# Patient Record
Sex: Male | Born: 1965 | Race: White | Hispanic: No | Smoking: Former smoker
Health system: Southern US, Community
[De-identification: ages and names within clinical notes are randomized; demographics above are authoritative.]

## PROBLEM LIST (undated history)

## (undated) DIAGNOSIS — J309 Allergic rhinitis, unspecified: Secondary | ICD-10-CM

## (undated) DIAGNOSIS — G4733 Obstructive sleep apnea (adult) (pediatric): Principal | ICD-10-CM

## (undated) DIAGNOSIS — E785 Hyperlipidemia, unspecified: Secondary | ICD-10-CM

## (undated) DIAGNOSIS — F909 Attention-deficit hyperactivity disorder, unspecified type: Secondary | ICD-10-CM

## (undated) DIAGNOSIS — F329 Major depressive disorder, single episode, unspecified: Secondary | ICD-10-CM

## (undated) HISTORY — DX: Attention-deficit hyperactivity disorder, unspecified type: F90.9

## (undated) HISTORY — DX: Obstructive sleep apnea (adult) (pediatric): G47.33

## (undated) HISTORY — DX: Major depressive disorder, single episode, unspecified: F32.9

## (undated) HISTORY — DX: Hyperlipidemia, unspecified: E78.5

## (undated) HISTORY — PX: NO PAST SURGERIES: SHX2092

## (undated) HISTORY — DX: Allergic rhinitis, unspecified: J30.9

---

## 2006-05-06 ENCOUNTER — Encounter: Admission: RE | Admit: 2006-05-06 | Discharge: 2006-05-06 | Payer: Self-pay | Admitting: Occupational Medicine

## 2006-06-30 ENCOUNTER — Ambulatory Visit: Payer: Self-pay | Admitting: Family Medicine

## 2006-07-02 ENCOUNTER — Ambulatory Visit: Payer: Self-pay | Admitting: Cardiology

## 2007-01-10 ENCOUNTER — Telehealth (INDEPENDENT_AMBULATORY_CARE_PROVIDER_SITE_OTHER): Payer: Self-pay | Admitting: *Deleted

## 2007-03-07 ENCOUNTER — Ambulatory Visit: Payer: Self-pay | Admitting: Family Medicine

## 2007-03-07 DIAGNOSIS — S60229A Contusion of unspecified hand, initial encounter: Secondary | ICD-10-CM | POA: Insufficient documentation

## 2007-03-08 ENCOUNTER — Telehealth (INDEPENDENT_AMBULATORY_CARE_PROVIDER_SITE_OTHER): Payer: Self-pay | Admitting: *Deleted

## 2007-03-13 ENCOUNTER — Encounter: Admission: RE | Admit: 2007-03-13 | Discharge: 2007-03-13 | Payer: Self-pay | Admitting: Family Medicine

## 2007-03-14 ENCOUNTER — Telehealth (INDEPENDENT_AMBULATORY_CARE_PROVIDER_SITE_OTHER): Payer: Self-pay | Admitting: *Deleted

## 2007-03-16 ENCOUNTER — Telehealth (INDEPENDENT_AMBULATORY_CARE_PROVIDER_SITE_OTHER): Payer: Self-pay | Admitting: *Deleted

## 2007-03-17 ENCOUNTER — Encounter (INDEPENDENT_AMBULATORY_CARE_PROVIDER_SITE_OTHER): Payer: Self-pay | Admitting: Family Medicine

## 2008-07-19 ENCOUNTER — Ambulatory Visit: Payer: Self-pay | Admitting: Internal Medicine

## 2008-07-19 ENCOUNTER — Telehealth (INDEPENDENT_AMBULATORY_CARE_PROVIDER_SITE_OTHER): Payer: Self-pay | Admitting: *Deleted

## 2008-07-24 ENCOUNTER — Telehealth (INDEPENDENT_AMBULATORY_CARE_PROVIDER_SITE_OTHER): Payer: Self-pay | Admitting: *Deleted

## 2008-09-10 ENCOUNTER — Telehealth (INDEPENDENT_AMBULATORY_CARE_PROVIDER_SITE_OTHER): Payer: Self-pay | Admitting: *Deleted

## 2008-09-11 ENCOUNTER — Telehealth (INDEPENDENT_AMBULATORY_CARE_PROVIDER_SITE_OTHER): Payer: Self-pay | Admitting: *Deleted

## 2010-10-03 NOTE — Assessment & Plan Note (Signed)
[facility]                        [facility] OFFICE NOTE   NAME:Yu, Larry                          MRN:          [id number]  DATE:06/30/2006                            DOB:          08/26/1965    REASON FOR VISIT:  Establish care and abnormal chest x-ray.  Larry Yu  is a 45 year old male who had a pre-employment physical at [facility] in December 2007.  The chest x-ray done showed a  noncalcified nodule in the left upper lobe.  A primary lung carcinoma  cannot be excluded, and thus a CT scan was recommended.  The patient is  here to have further evaluation.  He reports that he is completely  asymptomatic.  Denies any weight loss, weight gain, fever, or any other  constitutional symptoms.  He does report that he smokes a cigar  occasionally.  He denies any significant family history of cancer.   The patient also would like to be referred at a later date for a  vasectomy and will let us know.   During the pre-employment physical, he had a cholesterol panel  performed, which showed an elevated triglyceride of 366.  The patient  did state that, typically with lifestyle changes, he could bring that  back down to normal.   PAST MEDICAL HISTORY:  Esophageal dilatation.   MEDICATIONS:  None.   ALLERGIES:  No known drug allergies.   FAMILY HISTORY:  Mother alive with history of hypertension.  Father  alive and well.  He has 4 siblings who are alive and well.   SOCIAL HISTORY:  The patient is a [employer] fund[employer], married with 2 children.  Smokes a cigar occasionally and drinks wine occasionally.   HEALTH MAINTENANCE ISSUES:  Up to date.   OBJECTIVE:  Weight 245.8, temperature 98.1, pulse 76, blood pressure  118/80.  GENERAL:  We have a pleasant male in no acute distress.  Answers  questions appropriately.  OROPHARYNX:  Benign.  NECK:  Supple.  No lymphadenopathy or carotid bruit, or JVD.  LUNGS:  Clear with good air movement.  HEART:  Regular rate and rhythm with normal S1, S2.  No murmurs,  gallops, or rubs.   REVIEW OF X-RAY REPORT:  Consistent with a non-calcified nodule in the  left upper lobe.  Recommended was CT for further assessment.   REVIEW OF LABORATORY DATA:  From [facility] showed a total cholesterol of  230, triglyceride 366, HDL 31, LDL 118.   IMPRESSION:  1. Left upper lobe nodule, unclear etiology.  2. Hypertriglyceridemia.   PLAN:  1. Will schedule a CT scan of the lungs with and without contrast for      further assessment.  2. The patient is to schedule an appointment in 3 months to have a      lipid profile rechecked.  3. Diet changes were encouraged.  4. The patient will call in the near future to be referred for      vasectomy.  5. Further recommendations regarding the lung nodule after review of      the CT scan.  The  patient expressed understanding.     Leanne Chang, M.D.  Electronically Signed    LA/MedQ  DD: 06/30/2006  DT: 06/30/2006  Job #: [id number]

## 2010-10-03 NOTE — Assessment & Plan Note (Signed)
[facility]                        [facility] OFFICE NOTE   NAME:Hulon, Asencion                          MRN:          [id number]  DATE:06/30/2006                            DOB:          [birth year]    REASON FOR VISIT:  To establish care and abnormal chest x-ray.   Mr. Villarruel is a 45 year old male who reports   INCOMPLETE     Leanne Chang, M.D.  Electronically Signed    LA/MedQ  DD: 06/30/2006  DT: 06/30/2006  Job #: [id number]

## 2011-05-27 ENCOUNTER — Ambulatory Visit (INDEPENDENT_AMBULATORY_CARE_PROVIDER_SITE_OTHER): Payer: Self-pay | Admitting: Internal Medicine

## 2011-05-27 ENCOUNTER — Encounter: Payer: Self-pay | Admitting: Internal Medicine

## 2011-05-27 VITALS — BP 90/62 | HR 50 | Temp 97.9°F | Resp 14 | Wt 232.4 lb

## 2011-05-27 DIAGNOSIS — J209 Acute bronchitis, unspecified: Secondary | ICD-10-CM

## 2011-05-27 DIAGNOSIS — J019 Acute sinusitis, unspecified: Secondary | ICD-10-CM

## 2011-05-27 DIAGNOSIS — R233 Spontaneous ecchymoses: Secondary | ICD-10-CM

## 2011-05-27 LAB — CBC WITH DIFFERENTIAL/PLATELET
Basophils Relative: 0.9 % (ref 0.0–3.0)
Eosinophils Relative: 2.6 % (ref 0.0–5.0)
HCT: 42 % (ref 39.0–52.0)
Hemoglobin: 14.4 g/dL (ref 13.0–17.0)
Lymphocytes Relative: 20.5 % (ref 12.0–46.0)
Lymphs Abs: 1.7 10*3/uL (ref 0.7–4.0)
Monocytes Relative: 9 % (ref 3.0–12.0)
Neutro Abs: 5.5 10*3/uL (ref 1.4–7.7)
RBC: 4.69 Mil/uL (ref 4.22–5.81)
WBC: 8.2 10*3/uL (ref 4.5–10.5)

## 2011-05-27 MED ORDER — FLUTICASONE PROPIONATE 50 MCG/ACT NA SUSP: 1.0000 | Freq: Two times a day (BID) | NASAL | Status: DC | PRN

## 2011-05-27 MED ORDER — AMOXICILLIN-POT CLAVULANATE 875-125 MG PO TABS: 1.0000 | ORAL_TABLET | Freq: Two times a day (BID) | ORAL | Status: AC

## 2011-05-27 MED ORDER — HYDROCODONE-HOMATROPINE 5-1.5 MG/5ML PO SYRP: 5.0000 mL | ORAL_SOLUTION | Freq: Four times a day (QID) | ORAL | Status: AC | PRN

## 2011-05-27 NOTE — Progress Notes (Signed)
  Subjective:    Patient ID: Larry Yu, male    DOB: 14-Sep-1965, 46 y.o.   MRN: [id number]  HPI Respiratory tract infection Onset/symptoms:1 week ago as aching & fatigue. Note he did take the flu shot last fall Exposures (illness/environmental/extrinsic):friends ill; diagnoses included walking pneumonia, bronchitis, sinusitis and flu among those individuals Progression of symptoms:to cough Treatments/response:OTC meds / partial benefit Present symptoms: Fever/chills/sweats:no Frontal headache:yes Facial pain:yes Nasal purulence:no Sore throat:yes Dental pain:no Lymphadenopathy:no Wheezing/shortness of breath:both Cough/sputum/hemoptysis:yellow green sputum Pleuritic pain:no  Past medical history: Seasonal allergies: no/asthma:no Smoking history: Occasional cigar           Review of Systems   He is also noted some bruising recently; he has been taking nonsteroidals recently. He denies epistaxis, hemoptysis, hematuria, rectal bleeding, or melena.     Objective:   Physical Exam General appearance:good health ;well nourished; no acute distress or increased work of breathing is present.  No  lymphadenopathy of axilla noted; shotty lymph nodes in the left posterior neck.   Eyes: No conjunctival inflammation or lid edema is present.   Ears:  External ear exam shows no significant lesions or deformities.  Otoscopic examination reveals clear canals, tympanic membranes are intact bilaterally without bulging, retraction, inflammation or discharge.  Nose:  External nasal examination shows no deformity or inflammation. Nasal mucosa are erythematous and boggy on the right without lesions or exudates. No septal dislocation or deviation.Minimal  obstruction to airflow on R Oral exam: Dental hygiene is good; lips and gums are healthy appearing.There is no oropharyngeal erythema or exudate noted.   Heart:  Slow rate and regular rhythm. S1 and S2 normal without gallop, murmur, click, rub  or other extra sounds.   Lungs:Chest clear to auscultation; no wheezes, rhonchi,rales ,or rubs present.No increased work of breathing.  Abdomen: Scaphoid; no organomegaly or masses present    Extremities:  No cyanosis, edema, or clubbing  noted    Skin: Warm & dry ; faint bruising over the left biceps and over the right lower quadrant abdomen          Assessment & Plan:  #1 bronchitis with purulent secretions. He describes some wheezing; clinically no reactive airways disease is present at this time  #2 probable rhinosinusitis although he denies purulent secretions.  #3 easy bruising  Plan: See orders and recommendations

## 2011-05-27 NOTE — Patient Instructions (Signed)
Plain Mucinex for thick secretions ;force NON dairy fluids . Use a Neti pot daily as needed for sinus congestion .Flonase 1 spray in each nostril twice a day as needed. Use the "crossover" technique as discussed   

## 2012-08-12 ENCOUNTER — Ambulatory Visit (INDEPENDENT_AMBULATORY_CARE_PROVIDER_SITE_OTHER): Payer: Self-pay | Admitting: Internal Medicine

## 2012-08-12 ENCOUNTER — Encounter: Payer: Self-pay | Admitting: Internal Medicine

## 2012-08-12 VITALS — BP 114/78 | HR 69 | Temp 98.2°F | Wt 255.4 lb

## 2012-08-12 DIAGNOSIS — J019 Acute sinusitis, unspecified: Secondary | ICD-10-CM

## 2012-08-12 DIAGNOSIS — J029 Acute pharyngitis, unspecified: Secondary | ICD-10-CM

## 2012-08-12 DIAGNOSIS — J020 Streptococcal pharyngitis: Secondary | ICD-10-CM

## 2012-08-12 MED ORDER — AMOXICILLIN 500 MG PO CAPS: 500.0000 mg | ORAL_CAPSULE | Freq: Three times a day (TID) | ORAL | Status: DC

## 2012-08-12 MED ORDER — FLUTICASONE PROPIONATE 50 MCG/ACT NA SUSP: 1.0000 | Freq: Two times a day (BID) | NASAL | Status: DC | PRN

## 2012-08-12 NOTE — Patient Instructions (Addendum)
Plain Mucinex (NOT D) for thick secretions ;force NON dairy fluids .   Nasal cleansing in the shower as discussed with lather of mild shampoo.After 10 seconds wash off lather while  exhaling through nostrils. Make sure that all residual soap is removed to prevent irritation.  Fluticasone 1 spray in each nostril twice a day as needed. Use the "crossover" technique into opposite nostril spraying toward opposite ear @ 45 degree angle, not straight up into nostril.  Use a Neti pot daily only  as needed for significant sinus congestion; going from open side to congested side . Plain Allegra (NOT D )  160 daily , Loratidine 10 mg , OR Zyrtec 10 mg @ bedtime  as needed for itchy eyes & sneezing. Zicam Melts or Zinc lozenges as per package label for scratchy throat .

## 2012-08-12 NOTE — Progress Notes (Signed)
  Subjective:    Patient ID: Larry Yu, male    DOB: 12-Jan-1966, 47 y.o.   MRN: [id number]  HPI  Symptoms began 08/08/12 as sinus pressure in the frontal, facial, and ethmoid sinus areas associated with a sore throat. As of 3/26 he's had nasal purulence. He also has a cough with yellow sputum with some blood as of today. The volume of secretions is greater from the sinuses than from the chest. He believes that the blood-tinged material was coming from the sinuses. This was associated with edema of the uvula.  He's had some extrinsic symptoms of itchy, watery eyes and sneezing.  Rx: Nonsteroidals and Benadryl sinus over-the-counter were of some benefit. He had been out of town and did not have access to his fluticasone    Review of Systems He denies significant wheezing or shortness of breath. He also has had no fever, chills, or sweats     Objective:   Physical Exam  General appearance:good health ;well nourished; no acute distress or increased work of breathing is present.  No  lymphadenopathy about the head, neck, or axilla noted.   Eyes: No conjunctival inflammation or lid edema is present.   Ears:  External ear exam shows no significant lesions or deformities.  Otoscopic examination reveals clear canals, tympanic membranes are intact bilaterally with erythema but w/o  bulging or discharge.  Nose:  External nasal examination shows no deformity or inflammation. Nasal mucosa are pink and moist without lesions or exudates. No septal dislocation or deviation.No obstruction to airflow.   Oral exam: Dental hygiene is good; lips and gums are healthy appearing.There is uvular erythema & slight edema w/o exudate noted. Hoarse  Neck:  No deformities, masses, or tenderness noted.   Supple with full range of motion without pain.   Heart:  Normal rate and regular rhythm. S1 and S2 normal without gallop, murmur, click, rub or other extra sounds. S4  Lungs:Chest clear to auscultation; no wheezes,  rhonchi,rales ,or rubs present.No increased work of breathing.    Extremities:  No cyanosis, edema, or clubbing  noted    Skin: Warm & dry          Assessment & Plan:  #1 rhinosinusitis with purulent discharge with some blood-tinged character  #2 pharyngitis, negative strep   plan: See orders and recommendations

## 2012-12-21 ENCOUNTER — Encounter: Payer: Self-pay | Admitting: Internal Medicine

## 2012-12-21 ENCOUNTER — Ambulatory Visit (INDEPENDENT_AMBULATORY_CARE_PROVIDER_SITE_OTHER): Payer: BC Managed Care – PPO | Admitting: Internal Medicine

## 2012-12-21 VITALS — BP 110/80 | HR 55 | Temp 97.9°F | Ht 75.25 in | Wt 252.2 lb

## 2012-12-21 DIAGNOSIS — F32A Depression, unspecified: Secondary | ICD-10-CM

## 2012-12-21 DIAGNOSIS — Z0000 Encounter for general adult medical examination without abnormal findings: Secondary | ICD-10-CM

## 2012-12-21 DIAGNOSIS — F329 Major depressive disorder, single episode, unspecified: Secondary | ICD-10-CM

## 2012-12-21 DIAGNOSIS — J309 Allergic rhinitis, unspecified: Secondary | ICD-10-CM

## 2012-12-21 DIAGNOSIS — J302 Other seasonal allergic rhinitis: Secondary | ICD-10-CM | POA: Insufficient documentation

## 2012-12-21 HISTORY — DX: Depression, unspecified: F32.A

## 2012-12-21 MED ORDER — BUPROPION HCL 100 MG PO TABS: 100.0000 mg | ORAL_TABLET | Freq: Two times a day (BID) | ORAL | Status: DC

## 2012-12-21 NOTE — Assessment & Plan Note (Addendum)
Moderate depression, PHQ 9---->scored 12. Has minimal anxiety. I strongly encouraged him to continue going to counseling. We talk about Wellbutrin versus SSRIs. I think wellbutrin  is a better fit for him   since he has depression >> anxiety  Plan: Wellbutrin 100 mg twice a day, see instructions, return in 6 weeks.  Additional time spent with patient today assessing and treating depression

## 2012-12-21 NOTE — Assessment & Plan Note (Addendum)
Td per pt ~ 2008 Never had a cscope  EKG sinus brady Diet and exercise discussed, encouraged to keep going to the gym, [employer]?. Labs  He feels fatigued, has occ ED--- will reassess sxs after he is on Wellbutrin and improve his lifestyle

## 2012-12-21 NOTE — Patient Instructions (Addendum)
Start Wellbutrin 100 mg one tablet a day for 10 days, then one tablet twice a day.  Please come back for another visit in 6 weeks

## 2012-12-21 NOTE — Progress Notes (Signed)
  Subjective:    Patient ID: Larry Yu, male    DOB: 29-May-1965, 47 y.o.   MRN: [id number]  HPI CPX Also concerned about depression, anxiety  going on for a while---> he divorce and remarriage 2 years ago, his kids live out of state, some issues with his new family. They are already doing   counseling and it seems to be working. No suicidal ideas. Minimal anxiety if any.  Past Medical History  Diagnosis Date  . Allergic rhinitis    Past Surgical History  Procedure Laterality Date  . No past surgeries     History   Social History  . Marital Status: Married    Spouse Name: N/A    Number of Children: 2  . Years of Education: N/A   Occupational History  . Not on file.   Social History Main Topics  . Smoking status: Passive Smoke Exposure - Never Smoker    Types: Cigars  . Smokeless tobacco: Never Used     Comment: Cigars  occasionally.  . Alcohol Use: Yes     Comment: socially   . Drug Use: No  . Sexually Active: Not on file   Other Topics Concern  . Not on file   Social History Narrative   Divorced, remarried 2013, household is pt, wife and two children fem wife   Family History  Problem Relation Age of Onset  . CAD Neg Hx   . Diabetes Neg Hx   . Colon cancer Neg Hx   . Prostate cancer Neg Hx   . Hypertension Mother     Review of Systems Diet and exercise-- "not good" but he started to go to the gym again  last week  Occasionally fatigue, not inappropriate falling asleep. Denies chest pain or shortness or breath No  nausea, vomiting, constipation or blood in the stools. Occasionally loose stools . No weight loss. No dysuria, gross hematuria and difficulty urinating. Occasionally problems with erections.     Objective:   Physical Exam BP 110/80  Pulse 55  Temp(Src) 97.9 F (36.6 C) (Oral)  Ht 6' 3.25" (1.911 m)  Wt 252 lb 3.2 oz (114.397 kg)  BMI 31.33 kg/m2  SpO2 98%  General -- alert, well-developed, NAD.   Neck --no thyromegaly Lungs --  normal respiratory effort, no intercostal retractions, no accessory muscle use, and normal breath sounds.   Heart-- normal rate, regular rhythm, no murmur, and no gallop.   Abdomen--soft, non-tender, no distention, no masses  Extremities-- no pretibial edema bilaterally Neurologic-- alert & oriented X3 and strength normal in all extremities. Psych-- Cognition and judgment appear intact. Alert and cooperative with normal attention span and concentration.  not anxious appearing and not depressed appearing.       Assessment & Plan:

## 2012-12-22 ENCOUNTER — Encounter: Payer: Self-pay | Admitting: Internal Medicine

## 2012-12-22 LAB — COMPREHENSIVE METABOLIC PANEL
Albumin: 4.7 g/dL (ref 3.5–5.2)
BUN: 15 mg/dL (ref 6–23)
CO2: 30 mEq/L (ref 19–32)
Calcium: 9.7 mg/dL (ref 8.4–10.5)
Chloride: 100 mEq/L (ref 96–112)
Creatinine, Ser: 1.1 mg/dL (ref 0.4–1.5)
GFR: 74.38 mL/min (ref >60.00)
Potassium: 4.3 mEq/L (ref 3.5–5.1)

## 2012-12-22 LAB — LIPID PANEL
HDL: 39.2 mg/dL (ref >39.00)
Triglycerides: 279 mg/dL — ABNORMAL HIGH (ref 0.0–149.0)

## 2012-12-22 LAB — LDL CHOLESTEROL, DIRECT: Direct LDL: 151 mg/dL

## 2012-12-22 LAB — TSH: TSH: 0.82 u[IU]/mL (ref 0.35–5.50)

## 2012-12-23 ENCOUNTER — Encounter: Payer: Self-pay | Admitting: Internal Medicine

## 2013-02-06 ENCOUNTER — Encounter: Payer: Self-pay | Admitting: Internal Medicine

## 2013-02-06 ENCOUNTER — Ambulatory Visit (INDEPENDENT_AMBULATORY_CARE_PROVIDER_SITE_OTHER): Payer: BC Managed Care – PPO | Admitting: Internal Medicine

## 2013-02-06 VITALS — BP 125/83 | HR 55 | Temp 98.7°F | Wt 243.4 lb

## 2013-02-06 DIAGNOSIS — N529 Male erectile dysfunction, unspecified: Secondary | ICD-10-CM | POA: Insufficient documentation

## 2013-02-06 DIAGNOSIS — F32A Depression, unspecified: Secondary | ICD-10-CM

## 2013-02-06 DIAGNOSIS — F329 Major depressive disorder, single episode, unspecified: Secondary | ICD-10-CM

## 2013-02-06 MED ORDER — ESCITALOPRAM OXALATE 10 MG PO TABS: 10.0000 mg | ORAL_TABLET | Freq: Every day | ORAL | Status: DC

## 2013-02-06 NOTE — Assessment & Plan Note (Addendum)
ongoing symptoms, would like to try something. Samples of Cialis 20 mg #3 tablets provided, side effects discussed, to take half or one tablet qod as needed. Will call for a prescription if needed.

## 2013-02-06 NOTE — Assessment & Plan Note (Addendum)
Started Wellbutrin, it did help some after 2 or 3 weeks into treatment however at this point he's feeling about the same, he has also developed some anxiety and a couple of times had suicidal thoughts. Plan: Patient is counseled and encouraged to continue seeing the counselors Start Lexapro, decrease Wellbutrin  Counseled about suicidality F/u 4-5 weeks

## 2013-02-06 NOTE — Progress Notes (Signed)
  Subjective:    Patient ID: Larry Yu, male    DOB: [birth year], 47 y.o.   MRN: [id number]  HPI Followup Patient was depressed, started Wellbutrin, initially had some side effects "like I took too much caffeinne", after that he felt better, more motivated and energetic however in the last 2 weeks his back to his previous state.  Past Medical History  Diagnosis Date  . Allergic rhinitis    Past Surgical History  Procedure Laterality Date  . No past surgeries      Review of Systems Relationship w/ wife is not well, thinks they will probably divorce. They are seeing a marriage and a independent Veterinary surgeon .  No thoughts  of violence. Had "tension" in the shoulders and headache for several days but is better x the last 2 days. ED : Treatment?    Objective:   Physical Exam BP 125/83  Pulse 55  Temp(Src) 98.7 F (37.1 C)  Wt 243 lb 6.4 oz (110.406 kg)  BMI 30.23 kg/m2  SpO2 100%  General -- alert, well-developed, NAD.   Psych-- Cognition and judgment appear intact. Cooperative with normal attention span and concentration. No anxious appearing , no depressed appearing.       Assessment & Plan:  Labs reviewed, increased triglycerides, recommend diet. Minimal the elevated LFTs, recheck yearly

## 2013-02-06 NOTE — Patient Instructions (Addendum)
Start Lexapro Decrease Wellbutrin to 1 tablet daily. After 3 weeks, stop Wellbutrin. Next visit 4-5 weeks. Call if side effects , increased anxiety or  suicidal ideas.

## 2013-03-08 ENCOUNTER — Encounter: Payer: Self-pay | Admitting: Internal Medicine

## 2013-03-08 ENCOUNTER — Ambulatory Visit: Payer: BC Managed Care – PPO | Admitting: Internal Medicine

## 2013-03-08 ENCOUNTER — Ambulatory Visit (INDEPENDENT_AMBULATORY_CARE_PROVIDER_SITE_OTHER): Payer: BC Managed Care – PPO | Admitting: Internal Medicine

## 2013-03-08 VITALS — BP 123/76 | HR 67 | Temp 98.6°F | Wt 245.0 lb

## 2013-03-08 DIAGNOSIS — F329 Major depressive disorder, single episode, unspecified: Secondary | ICD-10-CM

## 2013-03-08 DIAGNOSIS — F32A Depression, unspecified: Secondary | ICD-10-CM

## 2013-03-08 DIAGNOSIS — F3289 Other specified depressive episodes: Secondary | ICD-10-CM

## 2013-03-08 DIAGNOSIS — N529 Male erectile dysfunction, unspecified: Secondary | ICD-10-CM

## 2013-03-08 MED ORDER — ESCITALOPRAM OXALATE 10 MG PO TABS: 10.0000 mg | ORAL_TABLET | Freq: Every day | ORAL | Status: DC

## 2013-03-08 MED ORDER — BUPROPION HCL 100 MG PO TABS: 100.0000 mg | ORAL_TABLET | Freq: Every day | ORAL | Status: DC

## 2013-03-08 MED ORDER — TADALAFIL 20 MG PO TABS: 10.0000 mg | ORAL_TABLET | ORAL | Status: DC | PRN

## 2013-03-08 NOTE — Assessment & Plan Note (Addendum)
Lexapro helped significantly, he felt tired so decided to restart Wellbutrin 100 mg one tablet daily, developed some headaches, then he change Wellbutrin to half tablet twice a day. With that regimen symptoms are under excellent control. Plan: Refill medications, continue counseling, followup in 3 months.

## 2013-03-08 NOTE — Progress Notes (Signed)
  Subjective:    Patient ID: Larry Yu, male    DOB: 07/15/1965, 47 y.o.   MRN: [id number]  HPI Followup from previous visit. Doing very well with a combination of Lexapro and Wellbutrin. Also, Cialis did help    Past Medical History  Diagnosis Date  . Allergic rhinitis    Past Surgical History  Procedure Laterality Date  . No past surgeries       Review of Systems Denies suicidal ideas Sleeping well Still sees a Veterinary surgeon.     Objective:   Physical Exam  BP 123/76  Pulse 67  Temp(Src) 98.6 F (37 C)  Wt 245 lb (111.131 kg)  BMI 30.43 kg/m2  SpO2 99% General -- alert, well-developed, NAD.   Psych-- Cognition and judgment appear intact. Cooperative with normal attention span and concentration. No anxious appearing , no depressed appearing.    Assessment & Plan:

## 2013-03-08 NOTE — Patient Instructions (Signed)
Next visit in 3 months  for a  depression flollow up   Please make an appointment

## 2013-03-08 NOTE — Assessment & Plan Note (Signed)
cialis worked, RF provided

## 2013-04-25 ENCOUNTER — Encounter: Payer: Self-pay | Admitting: Family Medicine

## 2013-04-25 ENCOUNTER — Ambulatory Visit (INDEPENDENT_AMBULATORY_CARE_PROVIDER_SITE_OTHER): Payer: BC Managed Care – PPO | Admitting: Family Medicine

## 2013-04-25 VITALS — BP 126/80 | HR 78 | Temp 98.0°F | Wt 248.8 lb

## 2013-04-25 DIAGNOSIS — J40 Bronchitis, not specified as acute or chronic: Secondary | ICD-10-CM

## 2013-04-25 MED ORDER — AZITHROMYCIN 250 MG PO TABS: ORAL_TABLET | ORAL | Status: DC

## 2013-04-25 MED ORDER — GUAIFENESIN-CODEINE 100-10 MG/5ML PO SYRP: ORAL_SOLUTION | ORAL | Status: DC

## 2013-04-25 NOTE — Progress Notes (Signed)
  Subjective:     Larry Yu is a 47 y.o. male here for evaluation of a cough. Onset of symptoms was 4 days ago. Symptoms have been gradually worsening since that time. The cough is productive and is aggravated by infection and reclining position. Associated symptoms include: shortness of breath, sputum production and wheezing. Patient does not have a history of asthma. Patient does have a history of environmental allergens. Patient has not traveled recently. Patient does not have a history of smoking. Patient has not had a previous chest x-ray. Patient has not had a PPD done.  The following portions of the patient's history were reviewed and updated as appropriate: allergies, current medications, past family history, past medical history, past social history, past surgical history and problem list.  Review of Systems Pertinent items are noted in HPI.    Objective:    Oxygen saturation 98% on room air BP 126/80  Pulse 78  Temp(Src) 98 F (36.7 C) (Oral)  Wt 248 lb 12.8 oz (112.855 kg)  SpO2 98% General appearance: alert, cooperative, appears stated age and no distress Ears: normal TM's and external ear canals both ears Nose: Nares normal. Septum midline. Mucosa normal. No drainage or sinus tenderness. Throat: lips, mucosa, and tongue normal; teeth and gums normal Neck: no adenopathy, supple, symmetrical, trachea midline and thyroid not enlarged, symmetric, no tenderness/mass/nodules Lungs: diminished breath sounds bibasilar Heart: S1, S2 normal    Assessment:    Acute Bronchitis    Plan:    Antibiotics per medication orders. Antitussives per medication orders. Avoid exposure to tobacco smoke and fumes. Call if shortness of breath worsens, blood in sputum, change in character of cough, development of fever or chills, inability to maintain nutrition and hydration. Avoid exposure to tobacco smoke and fumes. f/u prn

## 2013-04-25 NOTE — Patient Instructions (Signed)

## 2013-05-18 DIAGNOSIS — G4733 Obstructive sleep apnea (adult) (pediatric): Secondary | ICD-10-CM

## 2013-05-18 HISTORY — DX: Obstructive sleep apnea (adult) (pediatric): G47.33

## 2013-06-08 ENCOUNTER — Ambulatory Visit (INDEPENDENT_AMBULATORY_CARE_PROVIDER_SITE_OTHER): Payer: BC Managed Care – PPO | Admitting: Internal Medicine

## 2013-06-08 ENCOUNTER — Encounter: Payer: Self-pay | Admitting: Internal Medicine

## 2013-06-08 VITALS — BP 112/76 | HR 76 | Temp 98.0°F | Wt 250.0 lb

## 2013-06-08 DIAGNOSIS — R0683 Snoring: Secondary | ICD-10-CM

## 2013-06-08 DIAGNOSIS — F329 Major depressive disorder, single episode, unspecified: Secondary | ICD-10-CM

## 2013-06-08 DIAGNOSIS — F3289 Other specified depressive episodes: Secondary | ICD-10-CM

## 2013-06-08 DIAGNOSIS — R0609 Other forms of dyspnea: Secondary | ICD-10-CM

## 2013-06-08 DIAGNOSIS — F32A Depression, unspecified: Secondary | ICD-10-CM

## 2013-06-08 DIAGNOSIS — R0989 Other specified symptoms and signs involving the circulatory and respiratory systems: Secondary | ICD-10-CM

## 2013-06-08 MED ORDER — ESCITALOPRAM OXALATE 10 MG PO TABS: 20.0000 mg | ORAL_TABLET | Freq: Every day | ORAL | Status: DC

## 2013-06-08 NOTE — Progress Notes (Signed)
   Subjective:    Patient ID: Larry Yu, male    DOB: 1965-08-14, 48 y.o.   MRN: [id number]  HPI Routine followup Good compliance with Lexapro and Wellbutrin For the last 2 months his symptoms have not been as well-controlled as before, slightly more depressed, worries more. Admits that there has been some issues with his wife , they are arguing more.  Past Medical History  Diagnosis Date  . Allergic rhinitis   . Depression 12/21/2012   Past Surgical History  Procedure Laterality Date  . No past surgeries      Review of Systems When asked, reports he is sleeping well except for severe snoring, sometimes he wakes himself up from snoring. Occasionally feels sleepy during the day. Denies chest pain, shortness of breath, nausea, vomiting or diarrhea No suicidal ideas     Objective:   Physical Exam BP 112/76  Pulse 76  Temp(Src) 98 F (36.7 C)  Wt 250 lb (113.399 kg)  SpO2 96% General -- alert, well-developed, NAD. NECK-- normal to palpation, throat slt crowded  Neurologic--  alert & oriented X3. Speech normal, gait normal, strength normal in all extremities.  Psych-- Cognition and judgment appear intact. Cooperative with normal attention span and concentration. No anxious or depressed appearing.      Assessment & Plan:

## 2013-06-08 NOTE — Assessment & Plan Note (Addendum)
Patient reports heavy snoring, scored 9 in the Epworth scale He is high risk for OSA, never tested Plan-- home sleep study

## 2013-06-08 NOTE — Patient Instructions (Signed)
Next visit is for routine check up regards lexapro  in 2 months  No need to come back fasting Please make an appointment

## 2013-06-08 NOTE — Assessment & Plan Note (Signed)
Needs better control, increase Lexapro from 10 mg to 20 mg daily Continue Wellbutrin 100 mg half tablet twice a day He is already doing counseling Exercise encouraged

## 2013-06-08 NOTE — Progress Notes (Signed)
Pre visit review using our clinic review tool, if applicable. No additional management support is needed unless otherwise documented below in the visit note. 

## 2013-06-13 ENCOUNTER — Telehealth: Payer: Self-pay | Admitting: *Deleted

## 2013-06-13 NOTE — Telephone Encounter (Signed)
Still waiting on response from Dr. Larose Kells.

## 2013-06-13 NOTE — Telephone Encounter (Signed)
Patient was calling to check the status of his sleep study referral because his insurance did not cover it.

## 2013-06-15 ENCOUNTER — Other Ambulatory Visit: Payer: Self-pay | Admitting: *Deleted

## 2013-06-15 DIAGNOSIS — R0683 Snoring: Secondary | ICD-10-CM

## 2013-06-15 NOTE — Telephone Encounter (Signed)
Message copied by Dorian Pod on Thu Jun 15, 2013  8:37 AM ------      Message from: Kathlene November E      Created: Wed Jun 14, 2013  6:29 PM      Regarding: RE: home sleep study       Please arrange a pulmonary referral      ----- Message -----         From: Dorian Pod         Sent: 06/09/2013   2:07 PM           To: Colon Branch, MD      Subject: home sleep study                                         Patient does not have insurance coverage for home sleep study. How would you like to proceed?       ------

## 2013-06-15 NOTE — Telephone Encounter (Signed)
Can you enter pulmonary referral per Dr. Larose Kells? Thank you!

## 2013-06-15 NOTE — Telephone Encounter (Signed)
Referral for pulmonology has been entered. JG//CMA

## 2013-06-16 ENCOUNTER — Encounter: Payer: Self-pay | Admitting: Pulmonary Disease

## 2013-06-16 ENCOUNTER — Ambulatory Visit (INDEPENDENT_AMBULATORY_CARE_PROVIDER_SITE_OTHER): Payer: BC Managed Care – PPO | Admitting: Pulmonary Disease

## 2013-06-16 VITALS — BP 120/78 | HR 65 | Temp 98.0°F | Ht 75.0 in | Wt 257.4 lb

## 2013-06-16 DIAGNOSIS — G4733 Obstructive sleep apnea (adult) (pediatric): Secondary | ICD-10-CM

## 2013-06-16 NOTE — Patient Instructions (Signed)
Will schedule for home sleep test, and will review once results are available. Work on weight reduction.

## 2013-06-16 NOTE — Assessment & Plan Note (Signed)
The patient's history is classic for significant obstructive sleep apnea. He has loud snoring, witnessed apneas, nonrestorative sleep, and definite sleep pressure during the day with inactivity. He is overweight with a large neck. It had a long discussion with him about the pathophysiology of sleep apnea, including its impact to his quality of life and cardiovascular health. I think he needs to have a sleep study for diagnosis, and he is a very good candidate for home sleep testing. The patient is agreeable to this approach.

## 2013-06-16 NOTE — Progress Notes (Signed)
Subjective:    Patient ID: Larry Yu, male    DOB: 12-Sep-1965, 48 y.o.   MRN: [id number]  HPI The patient is a 49 year old male who I've been asked to see for possible obstructive sleep apnea. He has been noted to have loud snoring by his spouse, as well as an abnormal breathing pattern during sleep. He has frequent awakenings at night, and is not rested in the mornings upon arising. He notes definite inappropriate daytime sleepiness while at work when quiet, and has to get up and walk around in order to maintain his attention. He also drinks quite a bit of caffeine during the day as well. He notes sleepiness in the evenings while trying during, and some sleep pressure driving longer distances. His weight is up 30-40 pounds over the last 2 years, and his Epworth score today is abnormal at 16.   Sleep Questionnaire What time do you typically go to bed?( Between what hours) 10p-12a 10p-12a at 1601 on 06/16/13 by Virl Cagey, CMA How long does it take you to fall asleep? 68min-1hr 39min-1hr at 1601 on 06/16/13 by Virl Cagey, CMA How many times during the night do you wake up? 3 3 at 1601 on 06/16/13 by Virl Cagey, CMA What time do you get out of bed to start your day? 0700 0700 at 1601 on 06/16/13 by Virl Cagey, CMA Do you drive or operate heavy machinery in your occupation? Yes Yes at 1601 on 06/16/13 by Virl Cagey, CMA How much has your weight changed (up or down) over the past two years? (In pounds) 40 lb (18.144 kg) 40 lb (18.144 kg) at 1601 on 06/16/13 by Virl Cagey, CMA Have you ever had a sleep study before? No No at 1601 on 06/16/13 by Virl Cagey, CMA Do you currently use CPAP? No No at 1601 on 06/16/13 by Virl Cagey, CMA Do you wear oxygen at any time? No    Review of Systems  Constitutional: Negative for fever and unexpected weight change.  HENT: Negative for congestion, dental problem, ear pain, nosebleeds, postnasal drip,  rhinorrhea, sinus pressure, sneezing, sore throat and trouble swallowing.   Eyes: Negative for redness and itching.  Respiratory: Positive for cough and shortness of breath. Negative for chest tightness and wheezing.   Cardiovascular: Negative for palpitations and leg swelling.  Gastrointestinal: Negative for nausea and vomiting.       Acid heartburn  Genitourinary: Negative for dysuria.  Musculoskeletal: Negative for joint swelling.  Skin: Negative for rash.  Neurological: Negative for headaches.  Hematological: Does not bruise/bleed easily.  Psychiatric/Behavioral: Positive for dysphoric mood. The patient is nervous/anxious.        Objective:   Physical Exam Constitutional:  Overweight male, no acute distress  HENT:  Nares patent without discharge  Oropharynx without exudate, palate and uvula are moderately elongated.   Eyes:  Perrla, eomi, no scleral icterus  Neck:  No JVD, no TMG  Cardiovascular:  Normal rate, regular rhythm, no rubs or gallops.  No murmurs        Intact distal pulses  Pulmonary :  Normal breath sounds, no stridor or respiratory distress   No rales, rhonchi, or wheezing  Abdominal:  Soft, nondistended, bowel sounds present.  No tenderness noted.   Musculoskeletal:  No lower extremity edema noted.  Lymph Nodes:  No cervical lymphadenopathy noted  Skin:  No cyanosis noted  Neurologic:  Alert, appropriate, moves all 4 extremities without obvious deficit.  Assessment & Plan:

## 2013-06-28 ENCOUNTER — Telehealth: Payer: Self-pay | Admitting: Pulmonary Disease

## 2013-06-28 NOTE — Telephone Encounter (Signed)
lmomtcb x1 for pt 

## 2013-06-28 NOTE — Telephone Encounter (Signed)
Returning call can be reached at [phone].Elnita Maxwell

## 2013-06-29 NOTE — Telephone Encounter (Signed)
Pt returned call and I explained to patient that with pt's insurance the home sleep study is not a covered benefit. Pt is going to contact his insurance company see how much they will cover doing an in lab study. Pt given procedure code and will return my call to after checking with his insurance to let us know where he wants to proceed with in lab study. [name]

## 2013-06-29 NOTE — Telephone Encounter (Signed)
Spoke with Suanne Marker and she states that they are still awaiting a precert but she will check on this.  Spoke with Larry Yu and advised that we are still awaiting and we will contact him when this is complete.  Will forward to [facility] for f/u

## 2013-06-29 NOTE — Telephone Encounter (Signed)
LMOAM for pt to return my call. HST was denied by insurance (not a covered benefit). If patient wishes to have an in lab study, pt's insurance will cover this with no pre cert required. Ref # [id number]. [name]

## 2013-07-07 NOTE — Telephone Encounter (Signed)
Called and spoke with patient again today. Asked patient if he contacted his insurance and decided if he wanted to proceed with a in lab study. Pt stated that he had "put this on the back burner and had forgot about it". Provided patient with the in lab procedure code for the study and advised him to contact his insurance first part of the week so we can close this phone message out and take care of his needs as well. [name] Pt stated that he would call them on Monday and would return my call. [name]

## 2013-07-17 NOTE — Telephone Encounter (Signed)
Called and spoke with patient and he stated that he wants to "hold off on in lab study for now". Advised patient that since the HST is not covered by his insurance an in lab study is the only way that we will be able to proceed to determine if he has sleep apnea. Pt voiced understanding and stated that he would call us back when he wanted to proceed.  Nothing else needed at this time. [name]

## 2013-08-08 ENCOUNTER — Ambulatory Visit (INDEPENDENT_AMBULATORY_CARE_PROVIDER_SITE_OTHER): Payer: BC Managed Care – PPO | Admitting: Internal Medicine

## 2013-08-08 ENCOUNTER — Encounter: Payer: Self-pay | Admitting: Internal Medicine

## 2013-08-08 VITALS — BP 130/77 | HR 77 | Temp 98.3°F | Wt 251.0 lb

## 2013-08-08 DIAGNOSIS — G4733 Obstructive sleep apnea (adult) (pediatric): Secondary | ICD-10-CM

## 2013-08-08 DIAGNOSIS — F3289 Other specified depressive episodes: Secondary | ICD-10-CM

## 2013-08-08 DIAGNOSIS — F329 Major depressive disorder, single episode, unspecified: Secondary | ICD-10-CM

## 2013-08-08 DIAGNOSIS — F32A Depression, unspecified: Secondary | ICD-10-CM

## 2013-08-08 NOTE — Patient Instructions (Signed)
myfitnesspal ?  If you need more information about a healthy diet,   visit  the American Heart Association, it  is a great resource online at:  http://www.richard-flynn.net/   Next visit is for a physical exam by July 2015, fasting Please make an appointment

## 2013-08-08 NOTE — Assessment & Plan Note (Addendum)
Tried Lexapro 20 mg for a couple of weeks, did not see improvement and felt tired, he went back to Lexapro 10 mg. He continue with Wellbutrin, currently taking 100 mg one tablet daily. With this regimen, things seems to be  doing well. Plan: Reassess on return to the office

## 2013-08-08 NOTE — Progress Notes (Signed)
   Subjective:    Patient ID: Larry Yu, male    DOB: 1966/04/12, 48 y.o.   MRN: [id number]  DOS:  08/08/2013 Type of  visit: Followup from previous visit  Was dx w/ OSA, unable to afford  tests , note from pulmonary reviewed Tried a higher dose of lexapro, didn't work foir him , see a/p     ROS Has not been able to improve his lifestyle Currently his mood is under good control. Still feels some fatigue and unrested in the morning.  Past Medical History  Diagnosis Date  . Allergic rhinitis   . Depression 12/21/2012  . Hyperlipidemia   . OSA (obstructive sleep apnea) 05-2013    Past Surgical History  Procedure Laterality Date  . No past surgeries      History   Social History  . Marital Status: Married    Spouse Name: N/A    Number of Children: 2  . Years of Education: N/A   Occupational History  . Chemist    Social History Main Topics  . Smoking status: Current Some Day Smoker -- 1.00 packs/day for 13 years    Types: Cigarettes, Cigars    Last Attempt to Quit: 05/18/1993  . Smokeless tobacco: Never Used     Comment: Cigars  occasionally.  . Alcohol Use: Yes     Comment: socially 1-2 drinks ( 2 drinks 3x week)  . Drug Use: No     Comment: QUIT 1995  . Sexual Activity: Not on file   Other Topics Concern  . Not on file   Social History Narrative   Divorced, remarried 2013, household is pt, wife and two children from his wife        Medication List       This list is accurate as of: 08/08/13  6:57 PM.  Always use your most recent med list.               buPROPion 100 MG tablet  Commonly known as:  WELLBUTRIN  Take 1 tablet (100 mg total) by mouth daily.     escitalopram 10 MG tablet  Commonly known as:  LEXAPRO  Take 10 mg by mouth daily.     fluticasone 50 MCG/ACT nasal spray  Commonly known as:  FLONASE  Place 1 spray into the nose 2 (two) times daily as needed for rhinitis.     tadalafil 20 MG tablet  Commonly known as:  CIALIS  Take  0.5-1 tablets (10-20 mg total) by mouth every other day as needed for erectile dysfunction.           Objective:   Physical Exam BP 130/77  Pulse 77  Temp(Src) 98.3 F (36.8 C)  Wt 251 lb (113.853 kg)  SpO2 95%  General -- alert, well-developed, NAD.  Extremities-- no pretibial edema bilaterally  Neurologic--  alert & oriented X3. Speech normal, gait normal, strength normal in all extremities.  Psych-- Cognition and judgment appear intact. Cooperative with normal attention span and concentration. No anxious or depressed appearing.       Assessment & Plan:   Today , I spent more than 15 min with the patient, >50% of the time counseling

## 2013-08-08 NOTE — Assessment & Plan Note (Signed)
Clinically he has a sleep apnea, saw pulmonary, he was unable to afford testing. Plan is to work on lifestyle, lose weight. Extensive discussion about what sleep apnea is and risks as well as diet and exercise

## 2013-08-08 NOTE — Progress Notes (Signed)
Pre visit review using our clinic review tool, if applicable. No additional management support is needed unless otherwise documented below in the visit note. 

## 2013-08-09 ENCOUNTER — Telehealth: Payer: Self-pay | Admitting: Internal Medicine

## 2013-08-09 NOTE — Telephone Encounter (Signed)
Relevant patient education assigned to patient using Emmi. ° °

## 2013-09-14 ENCOUNTER — Other Ambulatory Visit: Payer: Self-pay | Admitting: Internal Medicine

## 2013-09-18 ENCOUNTER — Other Ambulatory Visit: Payer: Self-pay | Admitting: Internal Medicine

## 2013-12-01 ENCOUNTER — Encounter: Payer: Self-pay | Admitting: Internal Medicine

## 2013-12-01 ENCOUNTER — Ambulatory Visit (INDEPENDENT_AMBULATORY_CARE_PROVIDER_SITE_OTHER): Payer: BC Managed Care – PPO | Admitting: Internal Medicine

## 2013-12-01 VITALS — BP 100/66 | HR 80 | Temp 97.9°F | Ht 76.0 in | Wt 256.0 lb

## 2013-12-01 DIAGNOSIS — F32A Depression, unspecified: Secondary | ICD-10-CM

## 2013-12-01 DIAGNOSIS — F329 Major depressive disorder, single episode, unspecified: Secondary | ICD-10-CM

## 2013-12-01 DIAGNOSIS — G4733 Obstructive sleep apnea (adult) (pediatric): Secondary | ICD-10-CM

## 2013-12-01 DIAGNOSIS — Z0000 Encounter for general adult medical examination without abnormal findings: Secondary | ICD-10-CM

## 2013-12-01 LAB — CBC WITH DIFFERENTIAL/PLATELET
Basophils Absolute: 0.1 10*3/uL (ref 0.0–0.1)
Basophils Relative: 0.9 % (ref 0.0–3.0)
EOS ABS: 0.1 10*3/uL (ref 0.0–0.7)
Eosinophils Relative: 1.9 % (ref 0.0–5.0)
HCT: 43.2 % (ref 39.0–52.0)
Hemoglobin: 14.8 g/dL (ref 13.0–17.0)
Lymphocytes Relative: 23.6 % (ref 12.0–46.0)
Lymphs Abs: 1.5 10*3/uL (ref 0.7–4.0)
MCHC: 34.3 g/dL (ref 30.0–36.0)
MCV: 88.9 fl (ref 78.0–100.0)
MONO ABS: 0.5 10*3/uL (ref 0.1–1.0)
Monocytes Relative: 8.5 % (ref 3.0–12.0)
NEUTROS PCT: 65.1 % (ref 43.0–77.0)
Neutro Abs: 4.1 10*3/uL (ref 1.4–7.7)
PLATELETS: 226 10*3/uL (ref 150.0–400.0)
RBC: 4.86 Mil/uL (ref 4.22–5.81)
RDW: 12.8 % (ref 11.5–15.5)
WBC: 6.3 10*3/uL (ref 4.0–10.5)

## 2013-12-01 LAB — COMPREHENSIVE METABOLIC PANEL
ALK PHOS: 58 U/L (ref 39–117)
ALT: 41 U/L (ref 0–53)
AST: 24 U/L (ref 0–37)
Albumin: 4.3 g/dL (ref 3.5–5.2)
BILIRUBIN TOTAL: 0.6 mg/dL (ref 0.2–1.2)
BUN: 16 mg/dL (ref 6–23)
CO2: 30 mEq/L (ref 19–32)
Calcium: 9.2 mg/dL (ref 8.4–10.5)
Chloride: 104 mEq/L (ref 96–112)
Creatinine, Ser: 1.3 mg/dL (ref 0.4–1.5)
GFR: 63.01 mL/min (ref >60.00)
GLUCOSE: 91 mg/dL (ref 70–99)
Potassium: 4.2 mEq/L (ref 3.5–5.1)
SODIUM: 139 meq/L (ref 135–145)
TOTAL PROTEIN: 6.9 g/dL (ref 6.0–8.3)

## 2013-12-01 LAB — LIPID PANEL
CHOLESTEROL: 192 mg/dL (ref 0–200)
HDL: 36.8 mg/dL — ABNORMAL LOW (ref >39.00)
LDL CALC: 112 mg/dL — AB (ref 0–99)
NonHDL: 155.2
TRIGLYCERIDES: 218 mg/dL — AB (ref 0.0–149.0)
Total CHOL/HDL Ratio: 5
VLDL: 43.6 mg/dL — AB (ref 0.0–40.0)

## 2013-12-01 MED ORDER — TADALAFIL 20 MG PO TABS: 10.0000 mg | ORAL_TABLET | ORAL | Status: DC | PRN

## 2013-12-01 MED ORDER — ESCITALOPRAM OXALATE 10 MG PO TABS: 10.0000 mg | ORAL_TABLET | Freq: Every day | ORAL | Status: DC

## 2013-12-01 MED ORDER — BUPROPION HCL 100 MG PO TABS: 100.0000 mg | ORAL_TABLET | Freq: Every day | ORAL | Status: DC

## 2013-12-01 NOTE — Progress Notes (Signed)
Pre visit review using our clinic review tool, if applicable. No additional management support is needed unless otherwise documented below in the visit note. 

## 2013-12-01 NOTE — Progress Notes (Signed)
Subjective:    Patient ID: Larry Yu, male    DOB: [birth year], 48 y.o.   MRN: [id number]  DOS:  12/01/2013 Type of visit - description: CPX History: Recently separated from his second wife, it was an amicable process, he is doing emotionally well.  ROS Sleep apnea, doing better, see assessment and plan Denies chest pain, difficulty breathing or lower extremity edema No nausea, vomiting, occasional diarrhea but no abdominal pain or blood in the stools. Denies respiratory symptoms such as cough, wheezing or sputum production. Diet has not been the best lately but plans to improve  Past Medical History  Diagnosis Date  . Allergic rhinitis   . Depression 12/21/2012  . Hyperlipidemia   . OSA (obstructive sleep apnea) 05-2013    Past Surgical History  Procedure Laterality Date  . No past surgeries      History   Social History  . Marital Status: Married    Spouse Name: N/A    Number of Children: 2  . Years of Education: N/A   Occupational History  . Chemist    Social History Main Topics  . Smoking status: Current Some Day Smoker -- 1.00 packs/day for 13 years    Types: Cigarettes, Cigars    Last Attempt to Quit: 05/18/1993  . Smokeless tobacco: Never Used     Comment: Cigars  occasionally.  . Alcohol Use: Yes     Comment: socially 1-2 drinks ( 2 drinks 3x week)  . Drug Use: No     Comment: QUIT 1995  . Sexual Activity: Not on file   Other Topics Concern  . Not on file   Social History Narrative   Divorced, remarried 2013, separated, lives by himself      Family History  Problem Relation Age of Onset  . CAD Neg Hx   . Diabetes Neg Hx   . Colon cancer Neg Hx   . Prostate cancer Neg Hx   . Hypertension Mother   . Stroke Neg Hx   . Dementia Mother     dx age 108       Medication List       This list is accurate as of: 12/01/13 11:59 PM.  Always use your most recent med list.               buPROPion 100 MG tablet  Commonly known as:  WELLBUTRIN   Take 1 tablet (100 mg total) by mouth daily.     escitalopram 10 MG tablet  Commonly known as:  LEXAPRO  Take 1 tablet (10 mg total) by mouth daily.     fluticasone 50 MCG/ACT nasal spray  Commonly known as:  FLONASE  INHALE 1 SPRAY INTO THE NOSE TWICE DAILY AS NEEDED FOR RHINITIS.     tadalafil 20 MG tablet  Commonly known as:  CIALIS  Take 0.5-1 tablets (10-20 mg total) by mouth every other day as needed for erectile dysfunction.           Objective:   Physical Exam BP 100/66  Pulse 80  Temp(Src) 97.9 F (36.6 C)  Ht 6\' 4"  (1.93 m)  Wt 256 lb (116.121 kg)  BMI 31.17 kg/m2  SpO2 94% General -- alert, well-developed, NAD.  Neck --no thyromegaly  HEENT-- Not pale. Lungs -- normal respiratory effort, no intercostal retractions, no accessory muscle use, and normal breath sounds.  Heart-- normal rate, regular rhythm, no murmur.  Abdomen-- Not distended, good bowel sounds,soft, non-tender. Extremities-- no pretibial edema  bilaterally  Neurologic--  alert & oriented X3. Speech normal, gait appropriate for age, strength symmetric and appropriate for age.  Psych-- Cognition and judgment appear intact. Cooperative with normal attention span and concentration. No anxious or depressed appearing.           Assessment & Plan:

## 2013-12-01 NOTE — Assessment & Plan Note (Addendum)
See previous entry, he tried the nose strip but that didn't help; currently using a mouthguard and has decrease his snoring significantly. Feeling more energetic and less sleepy.

## 2013-12-01 NOTE — Assessment & Plan Note (Signed)
Recently separated from Emotionally doing okay, good compliance with medications. No change

## 2013-12-01 NOTE — Patient Instructions (Signed)
Get your blood work before you leave   Next visit is for a physical exam in 1 year, sooner if needed, come back fasting Please make an appointment

## 2013-12-01 NOTE — Assessment & Plan Note (Addendum)
Td 2012 Never had a cscope  Diet and exercise discussed, several suggestions made Labs   RTC 1 year, chronic medical issues stable, meds RF x 1 year

## 2014-03-09 ENCOUNTER — Ambulatory Visit (INDEPENDENT_AMBULATORY_CARE_PROVIDER_SITE_OTHER): Payer: BC Managed Care – PPO | Admitting: Internal Medicine

## 2014-03-09 ENCOUNTER — Encounter: Payer: Self-pay | Admitting: Internal Medicine

## 2014-03-09 VITALS — BP 120/80 | HR 72 | Temp 98.5°F | Wt 259.5 lb

## 2014-03-09 DIAGNOSIS — H9209 Otalgia, unspecified ear: Secondary | ICD-10-CM

## 2014-03-09 MED ORDER — FLUTICASONE PROPIONATE 50 MCG/ACT NA SUSP: NASAL | Status: DC

## 2014-03-09 MED ORDER — NEOMYCIN-POLYMYXIN-HC 1 % OT SOLN: 3.0000 [drp] | Freq: Four times a day (QID) | OTIC | Status: DC

## 2014-03-09 NOTE — Progress Notes (Signed)
Pre visit review using our clinic review tool, if applicable. No additional management support is needed unless otherwise documented below in the visit note. 

## 2014-03-09 NOTE — Progress Notes (Signed)
   Subjective:    Patient ID: Larry Yu, male    DOB: 21-Aug-1965, 48 y.o.   MRN: [id number]  DOS:  03/09/2014 Type of visit - description : acute Interval history: 5 week history of left ear discomfort described as tikling or burning there. "like a feather"   ROS Denies fever chills, mild runny nose, no sore throat. No sinus pain or congestion, mild frontal headache.   Past Medical History  Diagnosis Date  . Allergic rhinitis   . Depression 12/21/2012  . Hyperlipidemia   . OSA (obstructive sleep apnea) 05-2013    Past Surgical History  Procedure Laterality Date  . No past surgeries      History   Social History  . Marital Status: Married    Spouse Name: N/A    Number of Children: 2  . Years of Education: N/A   Occupational History  . Chemist    Social History Main Topics  . Smoking status: Current Some Day Smoker -- 1.00 packs/day for 13 years    Types: Cigarettes, Cigars    Last Attempt to Quit: 05/18/1993  . Smokeless tobacco: Never Used     Comment: Cigars  occasionally.  . Alcohol Use: Yes     Comment: socially 1-2 drinks ( 2 drinks 3x week)  . Drug Use: No     Comment: QUIT 1995  . Sexual Activity: Not on file   Other Topics Concern  . Not on file   Social History Narrative   Divorced, remarried 2013, separated, lives by himself         Medication List       This list is accurate as of: 03/09/14 11:59 PM.  Always use your most recent med list.               buPROPion 100 MG tablet  Commonly known as:  WELLBUTRIN  Take 1 tablet (100 mg total) by mouth daily.     escitalopram 10 MG tablet  Commonly known as:  LEXAPRO  Take 1 tablet (10 mg total) by mouth daily.     fluticasone 50 MCG/ACT nasal spray  Commonly known as:  FLONASE  INHALE 1 SPRAY INTO THE NOSE TWICE DAILY AS NEEDED FOR RHINITIS.     NEOMYCIN-POLYMYXIN-HYDROCORTISONE 1 % Soln otic solution  Commonly known as:  CORTISPORIN  Place 3 drops into the left ear 4 (four)  times daily.     tadalafil 20 MG tablet  Commonly known as:  CIALIS  Take 0.5-1 tablets (10-20 mg total) by mouth every other day as needed for erectile dysfunction.           Objective:   Physical Exam BP 120/80  Pulse 72  Temp(Src) 98.5 F (36.9 C) (Oral)  Wt 259 lb 8 oz (117.708 kg)  SpO2 93%  General -- alert, well-developed, NAD.   HEENT-- Not pale.  R Ear-- normal L TM Slightly bulge, no red; canal with minimal redness if any, no discharge,   or FB. Throat symmetric, no redness or discharge. Face symmetric, sinuses not tender to palpation. Nose  congested.  Neurologic--  alert & oriented X3. Speech normal, gait appropriate for age, strength symmetric and appropriate for age.   Psych--  Cooperative with normal attention span and concentration. No anxious or depressed appearing.       Assessment & Plan:   Ear discomfort, ET dysfunction? Minimal otitis externa? Plan: Consisting use Flonase, ear drops, see instruction, call if not improving

## 2014-03-09 NOTE — Patient Instructions (Signed)
flonase daily Ear drops x 5 days Call if no better

## 2014-03-13 ENCOUNTER — Telehealth: Payer: Self-pay | Admitting: Pulmonary Disease

## 2014-03-13 ENCOUNTER — Other Ambulatory Visit: Payer: Self-pay | Admitting: Pulmonary Disease

## 2014-03-13 DIAGNOSIS — G4733 Obstructive sleep apnea (adult) (pediatric): Secondary | ICD-10-CM

## 2014-03-13 NOTE — Telephone Encounter (Signed)
Pt aware order placed. Will send to [facility] to follow up on per pt request-- wanting to have this done soon.  Nothing further needed.

## 2014-03-13 NOTE — Telephone Encounter (Signed)
Order sent to [facility].

## 2014-03-13 NOTE — Telephone Encounter (Signed)
Last OV: 06/16/13   Patient Instructions     Will schedule for home sleep test, and will review once results are available.  Work on weight reduction    Pt would like to look at cost for sleep study now as he is closer to his meeting his deductible for the year. Pt states we tried for HST at the first of the year but his insurance requires he has an In lab sleep study. Pt would like to see if [facility] is willing to place order for this, our PCC's to do pre cert on this, and see about his cost. I explained to the patient that he would need to speak directly with his insurance company about the cost as each insurance carrier is different and they could explain his part of the cost.

## 2014-03-14 NOTE — Telephone Encounter (Signed)
appt scheduled for 04/16/14@8pm @[facility] pt will let me know if she does not want that date Joellen Jersey

## 2014-04-16 ENCOUNTER — Ambulatory Visit (HOSPITAL_BASED_OUTPATIENT_CLINIC_OR_DEPARTMENT_OTHER): Payer: BC Managed Care – PPO | Attending: Pulmonary Disease | Admitting: Radiology

## 2014-04-16 VITALS — Ht 75.0 in | Wt 250.0 lb

## 2014-04-16 DIAGNOSIS — G4710 Hypersomnia, unspecified: Secondary | ICD-10-CM | POA: Insufficient documentation

## 2014-04-16 DIAGNOSIS — G4733 Obstructive sleep apnea (adult) (pediatric): Secondary | ICD-10-CM | POA: Insufficient documentation

## 2014-04-17 DIAGNOSIS — G4733 Obstructive sleep apnea (adult) (pediatric): Secondary | ICD-10-CM

## 2014-04-17 NOTE — Progress Notes (Signed)
Larry Yu, pt needs ov to review his sleep study.

## 2014-04-17 NOTE — Progress Notes (Signed)
Called pt and appt scheduled for 04/18/14 at 4:30 w/ [facility]

## 2014-04-17 NOTE — Sleep Study (Signed)
   NAME: Larry Yu DATE OF BIRTH:  September 01, 1965 MEDICAL RECORD NUMBER [id number]  LOCATION: [facility]  PHYSICIAN: Shubuta OF STUDY: 04/16/2014  SLEEP STUDY TYPE: Nocturnal Polysomnogram               REFERRING PHYSICIAN: Ireland Chagnon, Armando Reichert, MD  INDICATION FOR STUDY: Hypersomnia with sleep apnea  EPWORTH SLEEPINESS SCORE:   15 HEIGHT: 6\' 3"  (190.5 cm)  WEIGHT: 250 lb (113.399 kg)    Body mass index is 31.25 kg/(m^2).  NECK SIZE: 19 in.  MEDICATIONS: Reviewed in the sleep record  SLEEP ARCHITECTURE: The patient had a total sleep time of 302 minutes, with no slow-wave sleep and very little REM. Sleep onset latency was normal at 16 minutes, and REM onset was at the upper limits of normal. Sleep efficiency was mildly reduced at 85%.  RESPIRATORY DATA: The patient was found to have 345 apneas, as well as 92 obstructive hypopneas, giving him an AHI of 87 events per hour. The events occurred in all body positions, and there was loud snoring noted throughout.  OXYGEN DATA: There was oxygen desaturation as low as 76% with the patient's obstructive events  CARDIAC DATA: No clinically significant arrhythmias were noted  MOVEMENT/PARASOMNIA: No periodic limb movements or other abnormal behaviors were seen.  IMPRESSION/ RECOMMENDATION:    1) severe obstructive sleep apnea/hypopnea syndrome, with an AHI of 87 events per hour and oxygen desaturation as low as 76%. Treatment for this degree of sleep apnea should focus on a trial of C Pap while working on aggressive weight loss.     [place], American Board of Sleep Medicine  ELECTRONICALLY SIGNED ON:  04/17/2014, 1:39 PM [place] PH: [phone]   FX: [phone] [place]

## 2014-04-18 ENCOUNTER — Ambulatory Visit (INDEPENDENT_AMBULATORY_CARE_PROVIDER_SITE_OTHER): Payer: BC Managed Care – PPO | Admitting: Pulmonary Disease

## 2014-04-18 ENCOUNTER — Encounter: Payer: Self-pay | Admitting: Pulmonary Disease

## 2014-04-18 VITALS — BP 126/78 | HR 71 | Temp 97.0°F | Ht 75.0 in | Wt 254.2 lb

## 2014-04-18 DIAGNOSIS — G4733 Obstructive sleep apnea (adult) (pediatric): Secondary | ICD-10-CM

## 2014-04-18 NOTE — Patient Instructions (Signed)
Will need to start you on cpap while you are working on weight loss Do some research on cpap machines, and discuss with your insurance company.  Let me know what you decide. Will need to see you back in 8 weeks after starting on cpap.

## 2014-04-18 NOTE — Assessment & Plan Note (Signed)
The patient has severe obstructive sleep apnea by his recent sleep study, and I have recommended that he start on C Pap while working aggressively on weight loss. I have explained that a dental appliance or surgery do not significantly impact this degree of sleep apnea. The patient is agreeable to starting C Pap, but would like to do his own research on the type of device in where he gets the device. I have asked that he make sure that it has auto capability, heated humidity, and a wireless chip so that we can monitor data.

## 2014-04-18 NOTE — Progress Notes (Signed)
   Subjective:    Patient ID: Larry Yu, male    DOB: 02/04/1966, 48 y.o.   MRN: [id number]  HPI The patient comes in today for follow-up of his recent sleep study. He was found to have severe OSA, with an AHI of 87 events per hour and oxygen desaturation as low as 76%. I have reviewed the study with him in detail, and answered all of his questions.   Review of Systems  Constitutional: Negative for fever and unexpected weight change.  HENT: Negative for congestion, dental problem, ear pain, nosebleeds, postnasal drip, rhinorrhea, sinus pressure, sneezing, sore throat and trouble swallowing.   Eyes: Negative for redness and itching.  Respiratory: Negative for cough, chest tightness, shortness of breath and wheezing.   Cardiovascular: Negative for palpitations and leg swelling.  Gastrointestinal: Negative for nausea and vomiting.  Genitourinary: Negative for dysuria.  Musculoskeletal: Negative for joint swelling.  Skin: Negative for rash.  Neurological: Negative for headaches.  Hematological: Does not bruise/bleed easily.  Psychiatric/Behavioral: Negative for dysphoric mood. The patient is not nervous/anxious.        Objective:   Physical Exam Obese male in no acute distress Nose without purulence or discharge noted Neck without lymphadenopathy or thyromegaly Lower extremities without edema, no cyanosis Alert and oriented, moves all 4 extremities.        Assessment & Plan:

## 2014-04-19 ENCOUNTER — Telehealth: Payer: Self-pay | Admitting: Pulmonary Disease

## 2014-04-19 NOTE — Telephone Encounter (Signed)
Pt called back and he stated that his insurance company will cover the following places:  [facility], [facility].   Pt stated that the insurance company told him to call back with any other places and they will check to see if any other places are covered.  I gave him [facility], [facility].  He will call back once he has figured out which place he would like to use.  He was given the number to [facility] to see what type of prices they can given him.  He will call back with all the information needed once he has made his decision.

## 2014-04-19 NOTE — Telephone Encounter (Signed)
lmtcb X1 for pt  

## 2014-04-19 NOTE — Telephone Encounter (Signed)
Pt called back. Reports [facility] is in network. He wants to order resmed s10 airsense auto. He wants to be sent for a mask fitting. Please advise [place] thanks

## 2014-04-20 ENCOUNTER — Other Ambulatory Visit: Payer: Self-pay | Admitting: Pulmonary Disease

## 2014-04-20 DIAGNOSIS — G4733 Obstructive sleep apnea (adult) (pediatric): Secondary | ICD-10-CM

## 2014-04-20 NOTE — Telephone Encounter (Signed)
Order sent.

## 2014-04-20 NOTE — Telephone Encounter (Signed)
Pt aware that message sent to [facility] to address. Will await order to be placed. Pt is asking that this be expedited, pt is wanting machine before the weekend. I advised the patient that we cannot make any guarantees of same day delivery but may be able to expedite his order.  Please advise [place] thanks.

## 2014-06-18 ENCOUNTER — Ambulatory Visit (INDEPENDENT_AMBULATORY_CARE_PROVIDER_SITE_OTHER): Payer: BLUE CROSS/BLUE SHIELD | Admitting: Pulmonary Disease

## 2014-06-18 ENCOUNTER — Encounter: Payer: Self-pay | Admitting: Pulmonary Disease

## 2014-06-18 DIAGNOSIS — G4733 Obstructive sleep apnea (adult) (pediatric): Secondary | ICD-10-CM

## 2014-06-18 NOTE — Progress Notes (Signed)
   Subjective:    Patient ID: Larry Yu, male    DOB: 1966-04-16, 49 y.o.   MRN: [id number]  HPI The patient comes in today for follow-up of his obstructive sleep apnea. Started on C Pap at the last visit, and is done very well with his device. His download shows an excellent compliance, and good control of his AHI. He is not having any mask leak issues, but has had some soreness from the nasal pillows. He feels that he is sleeping very well with the device, and has seen great improvement in his daytime alertness and energy level.   Review of Systems  Constitutional: Negative for fever and unexpected weight change.  HENT: Negative for congestion, dental problem, ear pain, nosebleeds, postnasal drip, rhinorrhea, sinus pressure, sneezing, sore throat and trouble swallowing.   Eyes: Negative for redness and itching.  Respiratory: Negative for cough, chest tightness, shortness of breath and wheezing.   Cardiovascular: Negative for palpitations and leg swelling.  Gastrointestinal: Negative for nausea and vomiting.  Genitourinary: Negative for dysuria.  Musculoskeletal: Negative for joint swelling.  Skin: Negative for rash.  Neurological: Negative for headaches.  Hematological: Does not bruise/bleed easily.  Psychiatric/Behavioral: Negative for dysphoric mood. The patient is not nervous/anxious.        Objective:   Physical Exam Overweight male in no acute distress Nose without purulence or discharge noted No skin breakdown or pressure necrosis from the C Pap mask Neck without lymphadenopathy or thyromegaly Lower extremities with minimal edema, no cyanosis Alert, does not appear to be sleepy, moves all 4 extremities.       Assessment & Plan:

## 2014-06-18 NOTE — Assessment & Plan Note (Signed)
The patient is doing very well on his C Pap, and his download shows excellent compliance and good control of his AHI. I would like to expand his pressure range a little bit since he is getting near the top at times, and he is agreeable with this. I have asked him to keep up with his mask changes and supplies, and to see me again in 6 months. Also encouraged him to work aggressively on weight loss.

## 2014-06-18 NOTE — Patient Instructions (Signed)
Continue on cpap, and will increase your pressure range to 5-20cm. Work on weight loss followup with me again in 26mos, but call if having issues.

## 2014-06-19 ENCOUNTER — Ambulatory Visit (INDEPENDENT_AMBULATORY_CARE_PROVIDER_SITE_OTHER): Payer: BLUE CROSS/BLUE SHIELD | Admitting: Internal Medicine

## 2014-06-19 ENCOUNTER — Encounter: Payer: Self-pay | Admitting: Internal Medicine

## 2014-06-19 VITALS — BP 119/78 | HR 70 | Temp 98.1°F | Ht 75.0 in | Wt 263.0 lb

## 2014-06-19 DIAGNOSIS — R1030 Lower abdominal pain, unspecified: Secondary | ICD-10-CM | POA: Insufficient documentation

## 2014-06-19 NOTE — Patient Instructions (Signed)
Please go to the lab

## 2014-06-19 NOTE — Progress Notes (Signed)
Pre visit review using our clinic review tool, if applicable. No additional management support is needed unless otherwise documented below in the visit note. 

## 2014-06-19 NOTE — Progress Notes (Signed)
Subjective:    Patient ID: Larry Yu, male    DOB: 12/25/1965, 49 y.o.   MRN: [id number]  DOS:  06/19/2014 Type of visit - description : acute Interval history: Symptoms started several months ago with lower abdominal pain, starts at the right side and then spreads to the left, no major problems at the upper abdomen. Symptoms were infrequent like every 2 months and now he had 2 episodes in the last 2 weeks. Pain decreases with self induce vomiting, no change in relationship to food intake, decrease with bowel movements?Marland Kitchen Also has loose stools on and off, this is not new , going on for years. No episodes of constipation   Review of Systems  denies fevers, occasional chills when he had pain. No unexplained weight loss. Denies any blood in the stools. Not taking Motrin or any similar medications consistently. Denies dysuria, gross hematuria, occasionally has experienced difficulty urinating when he has episodes of pain.   Past Medical History  Diagnosis Date  . Allergic rhinitis   . Depression 12/21/2012  . Hyperlipidemia   . OSA (obstructive sleep apnea) 05-2013    Past Surgical History  Procedure Laterality Date  . No past surgeries      History   Social History  . Marital Status: Married    Spouse Name: N/A    Number of Children: 2  . Years of Education: N/A   Occupational History  . Chemist    Social History Main Topics  . Smoking status: Current Some Day Smoker -- 1.00 packs/day for 13 years    Types: Cigarettes, Cigars    Last Attempt to Quit: 05/18/1993  . Smokeless tobacco: Never Used     Comment: Cigars  occasionally.  . Alcohol Use: Yes     Comment: socially 1-2 drinks ( 2 drinks 3x week)  . Drug Use: No     Comment: QUIT 1995  . Sexual Activity: Not on file   Other Topics Concern  . Not on file   Social History Narrative   Divorced, remarried 2013, separated, lives by himself         Medication List       This list is accurate as of: 06/19/14  11:59 PM.  Always use your most recent med list.               buPROPion 100 MG tablet  Commonly known as:  WELLBUTRIN  Take 1 tablet (100 mg total) by mouth daily.     escitalopram 10 MG tablet  Commonly known as:  LEXAPRO  Take 1 tablet (10 mg total) by mouth daily.     fluticasone 50 MCG/ACT nasal spray  Commonly known as:  FLONASE  INHALE 1 SPRAY INTO THE NOSE TWICE DAILY AS NEEDED FOR RHINITIS.     tadalafil 20 MG tablet  Commonly known as:  CIALIS  Take 0.5-1 tablets (10-20 mg total) by mouth every other day as needed for erectile dysfunction.           Objective:   Physical Exam  Constitutional: He is oriented to person, place, and time. He appears well-developed. No distress.  HENT:  Head: Normocephalic and atraumatic.  Cardiovascular:  RRR, no murmur, rub or gallop  Pulmonary/Chest: Effort normal. No respiratory distress.  CTA B  Abdominal: Soft. Bowel sounds are normal. He exhibits no distension and no mass. There is no tenderness. There is no rebound and no guarding.  Genitourinary:  Rectal exam normal, no stools found, prostate  is hard to reach but seems normal without tenderness  Musculoskeletal: Normal range of motion. He exhibits no edema or tenderness.  Neurological: He is alert and oriented to person, place, and time. No cranial nerve deficit. He exhibits normal muscle tone. Coordination normal.  Speech normal, gait unassisted and normal for age, motor strength appropriate for age   Skin: Skin is warm and dry. No pallor.  Psychiatric: He has a normal mood and affect. His behavior is normal. Judgment and thought content normal.  Vitals reviewed.        Assessment & Plan:   Problem List Items Addressed This Visit    Lower abdominal pain - Primary    Several months history of on and off lower abdominal pain in the background of what he described as chronic on and off diarrhea. No history of anemia. Plan:  Refer to GI, colonoscopy? (Colitis?  IBS?) Labs including a UA       Relevant Orders   Urinalysis, Routine w reflex microscopic   Comprehensive metabolic panel   CBC with Differential/Platelet

## 2014-06-19 NOTE — Assessment & Plan Note (Addendum)
Several months history of on and off lower abdominal pain in the background of what he described as chronic on and off diarrhea. No history of anemia. Plan:  Refer to GI, colonoscopy? (Colitis? IBS?) Labs including a UA

## 2014-06-20 LAB — URINALYSIS, ROUTINE W REFLEX MICROSCOPIC
BILIRUBIN URINE: NEGATIVE
Hgb urine dipstick: NEGATIVE
Ketones, ur: NEGATIVE
Leukocytes, UA: NEGATIVE
Nitrite: NEGATIVE
PH: 5.5 (ref 5.0–8.0)
RBC / HPF: NONE SEEN (ref 0–?)
TOTAL PROTEIN, URINE-UPE24: NEGATIVE
UROBILINOGEN UA: 0.2 (ref 0.0–1.0)
Urine Glucose: NEGATIVE
WBC, UA: NONE SEEN (ref 0–?)

## 2014-06-20 LAB — COMPREHENSIVE METABOLIC PANEL
ALBUMIN: 4.5 g/dL (ref 3.5–5.2)
ALK PHOS: 62 U/L (ref 39–117)
ALT: 40 U/L (ref 0–53)
AST: 23 U/L (ref 0–37)
BUN: 17 mg/dL (ref 6–23)
CHLORIDE: 103 meq/L (ref 96–112)
CO2: 31 meq/L (ref 19–32)
CREATININE: 1.09 mg/dL (ref 0.40–1.50)
Calcium: 9.5 mg/dL (ref 8.4–10.5)
GFR: 77.03 mL/min (ref >60.00)
GLUCOSE: 105 mg/dL — AB (ref 70–99)
Potassium: 3.8 mEq/L (ref 3.5–5.1)
SODIUM: 138 meq/L (ref 135–145)
TOTAL PROTEIN: 6.9 g/dL (ref 6.0–8.3)
Total Bilirubin: 0.5 mg/dL (ref 0.2–1.2)

## 2014-06-20 LAB — CBC WITH DIFFERENTIAL/PLATELET
BASOS ABS: 0 10*3/uL (ref 0.0–0.1)
Basophils Relative: 0.3 % (ref 0.0–3.0)
EOS ABS: 0.1 10*3/uL (ref 0.0–0.7)
EOS PCT: 1.7 % (ref 0.0–5.0)
HEMATOCRIT: 42.7 % (ref 39.0–52.0)
HEMOGLOBIN: 14.8 g/dL (ref 13.0–17.0)
LYMPHS ABS: 1.7 10*3/uL (ref 0.7–4.0)
Lymphocytes Relative: 22.1 % (ref 12.0–46.0)
MCHC: 34.7 g/dL (ref 30.0–36.0)
MCV: 88.3 fl (ref 78.0–100.0)
Monocytes Absolute: 0.5 10*3/uL (ref 0.1–1.0)
Monocytes Relative: 6.1 % (ref 3.0–12.0)
Neutro Abs: 5.5 10*3/uL (ref 1.4–7.7)
Neutrophils Relative %: 69.8 % (ref 43.0–77.0)
PLATELETS: 188 10*3/uL (ref 150.0–400.0)
RBC: 4.83 Mil/uL (ref 4.22–5.81)
RDW: 13.4 % (ref 11.5–15.5)
WBC: 7.9 10*3/uL (ref 4.0–10.5)

## 2014-06-26 ENCOUNTER — Ambulatory Visit: Payer: BLUE CROSS/BLUE SHIELD | Admitting: Nurse Practitioner

## 2014-06-27 ENCOUNTER — Encounter: Payer: Self-pay | Admitting: Nurse Practitioner

## 2014-06-27 ENCOUNTER — Ambulatory Visit (INDEPENDENT_AMBULATORY_CARE_PROVIDER_SITE_OTHER): Payer: BLUE CROSS/BLUE SHIELD | Admitting: Nurse Practitioner

## 2014-06-27 ENCOUNTER — Telehealth: Payer: Self-pay | Admitting: Internal Medicine

## 2014-06-27 VITALS — BP 110/80 | HR 72 | Ht 75.0 in | Wt 263.6 lb

## 2014-06-27 DIAGNOSIS — R1030 Lower abdominal pain, unspecified: Secondary | ICD-10-CM

## 2014-06-27 DIAGNOSIS — R109 Unspecified abdominal pain: Secondary | ICD-10-CM

## 2014-06-27 DIAGNOSIS — R197 Diarrhea, unspecified: Secondary | ICD-10-CM

## 2014-06-27 DIAGNOSIS — R194 Change in bowel habit: Secondary | ICD-10-CM

## 2014-06-27 DIAGNOSIS — R1084 Generalized abdominal pain: Secondary | ICD-10-CM | POA: Insufficient documentation

## 2014-06-27 DIAGNOSIS — R198 Other specified symptoms and signs involving the digestive system and abdomen: Secondary | ICD-10-CM

## 2014-06-27 MED ORDER — MOVIPREP 100 G PO SOLR: 1.0000 | Freq: Once | ORAL | Status: DC

## 2014-06-27 NOTE — Patient Instructions (Signed)
You have been scheduled for a colonoscopy. Please follow written instructions given to you at your visit today.  Please pick up your prep kit at the pharmacy within the next 1-3 days. If you use inhalers (even only as needed), please bring them with you on the day of your procedure. Your physician has requested that you go to www.startemmi.com and enter the access code given to you at your visit today. This web site gives a general overview about your procedure. However, you should still follow specific instructions given to you by our office regarding your preparation for the procedure.  [place]:[facility] P[facility]

## 2014-06-27 NOTE — Progress Notes (Signed)
Agree with initial assessment and plans 

## 2014-06-27 NOTE — Telephone Encounter (Signed)
Spoke with Pt, informed him of GI and Dr. Larose Kells recommendations. Pt verbalized understanding.

## 2014-06-27 NOTE — Progress Notes (Signed)
HPI :  Patient is a 49 year old male referred by Dr. Larose Kells for evaluation of abdominal pain. Patient gives a several month history of right mid abdominal pain radiating downward into right lower abdomen and into groin. Episodes are becoming more frequent, they can last for several hours. During these episodes patient gets significant chills.  He may feel urge to urinate, defecate or vomit but doesn't necessarily do so. Patient has noted that these episodes have often occurred atter prolonged periods of standing but not sure if related.   Patient gives a one year history of bowel changes. He has frequent non-bloody diarrhea but normal formed stools as well. Weight is stable. He started Wellbutrin and celexa within the last year.    Past Medical History  Diagnosis Date  . Allergic rhinitis   . Depression 12/21/2012  . Hyperlipidemia   . OSA (obstructive sleep apnea) 05-2013    Family History  Problem Relation Age of Onset  . CAD Neg Hx   . Diabetes Neg Hx   . Colon cancer Neg Hx   . Prostate cancer Neg Hx   . Hypertension Mother   . Stroke Neg Hx   . Dementia Mother     dx age 27   History  Substance Use Topics  . Smoking status: Current Some Day Smoker -- 1.00 packs/day for 13 years    Types: Cigarettes, Cigars    Last Attempt to Quit: 05/18/1993  . Smokeless tobacco: Never Used     Comment: Cigars  occasionally.  . Alcohol Use: Yes     Comment: socially 1-2 drinks ( 2 drinks 3x week)   Current Outpatient Prescriptions  Medication Sig Dispense Refill  . buPROPion (WELLBUTRIN) 100 MG tablet Take 1 tablet (100 mg total) by mouth daily. 30 tablet 12  . escitalopram (LEXAPRO) 10 MG tablet Take 1 tablet (10 mg total) by mouth daily. 30 tablet 12  . fluticasone (FLONASE) 50 MCG/ACT nasal spray INHALE 1 SPRAY INTO THE NOSE TWICE DAILY AS NEEDED FOR RHINITIS. 16 g 12  . tadalafil (CIALIS) 20 MG tablet Take 0.5-1 tablets (10-20 mg total) by mouth every other day as needed for erectile  dysfunction. (Patient not taking: Reported on 06/27/2014) 10 tablet 6   No current facility-administered medications for this visit.   No Known Allergies   Review of Systems: All systems reviewed and negative except where noted in HPI.   Physical Exam: BP 110/80 mmHg  Pulse 72  Ht 6\' 3"  (1.905 m)  Wt 263 lb 9.6 oz (119.568 kg)  BMI 32.95 kg/m2 Constitutional: Pleasant,well-developed, white male in no acute distress. HEENT: Normocephalic and atraumatic. Conjunctivae are normal. No scleral icterus. Neck supple.  Cardiovascular: Normal rate, regular rhythm.  Pulmonary/chest: Effort normal and breath sounds normal. No wheezing, rales or rhonchi. Abdominal: Soft, nondistended, nontender. Bowel sounds active throughout. There are no masses palpable. No hepatomegaly. Extremities: no edema Lymphadenopathy: No cervical adenopathy noted. Neurological: Alert and oriented to person place and time. Skin: Skin is warm and dry. No rashes noted. Psychiatric: Normal mood and affect. Behavior is normal.   ASSESSMENT AND PLAN:  56. 49 year old male referred for abdominal pain. He has episodes of right sided pain radiating downward as far as the groin and associated with chills. Not sure that pain is GI in origin. There are calcium oxylate crystals in his urine raising question of whether he has nephrolithiasis. I spoke with patient's PCP who will schedule him for renal ultrasound.   2.  Bowel changes. One year history of frequent loose stools. He may have IBS but other etiologies should be excluded. Especially given presence of abdominal pain.  Will schedule patient for a colonoscopy. The risks, benefits, and alternatives to colonoscopy with possible biopsy and possible polypectomy were discussed with the patient and he consents to proceed.      Cc: Kathlene November, MD

## 2014-06-27 NOTE — Telephone Encounter (Signed)
Call from GI, they saw the patient for abdominal pain, while they are ordering a colonoscopy they are also concerned about possible kidney stones as the patient reports severe but episodic pain and he had some crystals in the urine. Plan:  Advise patient, will order a CT abdomen and pelvis to rule out kidney stones

## 2014-07-05 ENCOUNTER — Encounter: Payer: Self-pay | Admitting: Internal Medicine

## 2014-07-20 ENCOUNTER — Encounter: Payer: BLUE CROSS/BLUE SHIELD | Admitting: Internal Medicine

## 2014-11-14 ENCOUNTER — Encounter: Payer: Self-pay | Admitting: Pulmonary Disease

## 2014-11-16 ENCOUNTER — Ambulatory Visit (INDEPENDENT_AMBULATORY_CARE_PROVIDER_SITE_OTHER): Payer: BLUE CROSS/BLUE SHIELD | Admitting: Internal Medicine

## 2014-11-16 ENCOUNTER — Other Ambulatory Visit: Payer: Self-pay

## 2014-11-16 ENCOUNTER — Encounter: Payer: Self-pay | Admitting: Internal Medicine

## 2014-11-16 VITALS — BP 116/64 | HR 64 | Temp 98.1°F | Ht 75.0 in | Wt 265.1 lb

## 2014-11-16 DIAGNOSIS — A6000 Herpesviral infection of urogenital system, unspecified: Secondary | ICD-10-CM | POA: Diagnosis not present

## 2014-11-16 DIAGNOSIS — F329 Major depressive disorder, single episode, unspecified: Secondary | ICD-10-CM | POA: Diagnosis not present

## 2014-11-16 DIAGNOSIS — J069 Acute upper respiratory infection, unspecified: Secondary | ICD-10-CM | POA: Diagnosis not present

## 2014-11-16 DIAGNOSIS — F32A Depression, unspecified: Secondary | ICD-10-CM

## 2014-11-16 MED ORDER — BUPROPION HCL 100 MG PO TABS: 100.0000 mg | ORAL_TABLET | Freq: Two times a day (BID) | ORAL | Status: DC

## 2014-11-16 MED ORDER — VALACYCLOVIR HCL 500 MG PO TABS: 500.0000 mg | ORAL_TABLET | Freq: Two times a day (BID) | ORAL | Status: AC

## 2014-11-16 NOTE — Assessment & Plan Note (Addendum)
Genital herpes by history, had a recent outbreak of genital herpes, previous outbreak was 6 years ago. Exam is negative Plan: Valtrex for 3 days, then as needed for outbreaks. See instructions

## 2014-11-16 NOTE — Assessment & Plan Note (Addendum)
Having some stress due to finalizing the divorce. Currently on Larry Yu 100 mg in the morning and Larry Yu 10 mg at night. Symptoms well-controlled but feels like Larry Yu is causing some "grogginess" and difficulty focusing. We talk about possibly switching to another medication such as Cymbalta or Effexor. We eventually agree to continue with Larry Yu which worked really well for Larry Yu and increase Larry Yu to twice a day hoping to minimize the grogginess which is felt to be a side effects from Larry Yu. He'll come back in few weeks for his physical, will reassess the situation then

## 2014-11-16 NOTE — Progress Notes (Signed)
Pre visit review using our clinic review tool, if applicable. No additional management support is needed unless otherwise documented below in the visit note. 

## 2014-11-16 NOTE — Patient Instructions (Signed)
Increase Wellbutrin to twice a day  Valtrex twice a day for 3 days for each episode of herpes

## 2014-11-16 NOTE — Progress Notes (Signed)
Subjective:    Patient ID: Larry Yu, male    DOB: 1965-07-27, 49 y.o.   MRN: [id number]  DOS:  11/16/2014 Type of visit - description : Acute, several concerns Interval history: Developed a rash at the penis few days ago, it was consistent with his history of herpes, last episode about 6 years ago, he actually has a leftover Valtrex he took 2 tablets yesterday and is feeling better. Also, he is lately under a lot of stress, going through a divorce. He is taking both Lexapro and Wellbutrin and symptoms are well-controlled however feels  Lexapro make him groggy, slightly unfocused. Change medication?  Finally, developed sore throat, hoarse voice in the last few days. Feeling slightly better    Review of Systems  no fever chills No sinus pain or congestion Denies dysuria, hematuria or difficulty urinating. Compliance with CPAP excellent  Past Medical History  Diagnosis Date  . Allergic rhinitis   . Depression 12/21/2012  . Hyperlipidemia   . OSA (obstructive sleep apnea) 05-2013    Past Surgical History  Procedure Laterality Date  . No past surgeries      History   Social History  . Marital Status: Married    Spouse Name: N/A  . Number of Children: 2  . Years of Education: N/A   Occupational History  . Chemist    Social History Main Topics  . Smoking status: Current Some Day Smoker -- 1.00 packs/day for 13 years    Types: Cigarettes, Cigars    Last Attempt to Quit: 05/18/1993  . Smokeless tobacco: Never Used     Comment: Cigars  occasionally.  . Alcohol Use: Yes     Comment: socially 1-2 drinks ( 2 drinks 3x week)  . Drug Use: No     Comment: QUIT 1995  . Sexual Activity: Not on file   Other Topics Concern  . Not on file   Social History Narrative   Divorced, remarried 2013, separated, lives by himself         Medication List       This list is accurate as of: 11/16/14 11:59 PM.  Always use your most recent med list.               buPROPion 100  MG tablet  Commonly known as:  WELLBUTRIN  Take 1 tablet (100 mg total) by mouth 2 (two) times daily.     escitalopram 10 MG tablet  Commonly known as:  LEXAPRO  Take 1 tablet (10 mg total) by mouth daily.     fluticasone 50 MCG/ACT nasal spray  Commonly known as:  FLONASE  INHALE 1 SPRAY INTO THE NOSE TWICE DAILY AS NEEDED FOR RHINITIS.     tadalafil 20 MG tablet  Commonly known as:  CIALIS  Take 0.5-1 tablets (10-20 mg total) by mouth every other day as needed for erectile dysfunction.     valACYclovir 500 MG tablet  Commonly known as:  VALTREX  Take 1 tablet (500 mg total) by mouth 2 (two) times daily.           Objective:   Physical Exam BP 116/64 mmHg  Pulse 64  Temp(Src) 98.1 F (36.7 C) (Oral)  Ht 6\' 3"  (1.905 m)  Wt 265 lb 2 oz (120.26 kg)  BMI 33.14 kg/m2  SpO2 99%  General:   Well developed, well nourished . NAD.  HEENT:  Normocephalic . Face symmetric, atraumatic Nose is slightly congested, TMs without redness or discharge. Throat  with minimal redness, no discharge. Lungs:  CTA B Normal respiratory effort, no intercostal retractions, no accessory muscle use. Heart: RRR,  no murmur.  No pretibial edema bilaterally  Skin: Not pale. Not jaundice. GU: Area where he had the blisters is now healed. Neurologic:  alert & oriented X3.  Speech normal, gait appropriate for age and unassisted Psych--  Cognition and judgment appear intact.  Cooperative with normal attention span and concentration.  Behavior appropriate. No anxious or depressed appearing.       Assessment & Plan:    URI, no evidence of arterial sinusitis on clinical grounds, recommend OTCs

## 2014-12-04 ENCOUNTER — Encounter: Payer: BLUE CROSS/BLUE SHIELD | Admitting: Internal Medicine

## 2014-12-17 ENCOUNTER — Ambulatory Visit: Payer: BLUE CROSS/BLUE SHIELD | Admitting: Pulmonary Disease

## 2014-12-28 ENCOUNTER — Other Ambulatory Visit: Payer: Self-pay | Admitting: Internal Medicine

## 2014-12-31 ENCOUNTER — Telehealth: Payer: Self-pay | Admitting: Internal Medicine

## 2014-12-31 ENCOUNTER — Other Ambulatory Visit: Payer: Self-pay

## 2014-12-31 MED ORDER — ESCITALOPRAM OXALATE 10 MG PO TABS: 10.0000 mg | ORAL_TABLET | Freq: Every day | ORAL | Status: DC

## 2014-12-31 NOTE — Telephone Encounter (Signed)
Relation to pt: self  Call back [place]: [id number] - [place], [place] - [address] AT [place] [phone] (Phone) [phone] (Fax)         Reason for call:  Patient is completely out of escitalopram (LEXAPRO) 10 MG tablet patient checked with pharmacy and pharmacy has not received Rx.

## 2014-12-31 NOTE — Telephone Encounter (Signed)
Lexapro sent to [facility] on 12/28/2014, however, resent to [facility] as requested.

## 2015-01-30 ENCOUNTER — Encounter: Payer: Self-pay | Admitting: Internal Medicine

## 2015-01-30 ENCOUNTER — Ambulatory Visit (INDEPENDENT_AMBULATORY_CARE_PROVIDER_SITE_OTHER): Payer: BLUE CROSS/BLUE SHIELD | Admitting: Internal Medicine

## 2015-01-30 VITALS — BP 116/76 | HR 52 | Temp 97.9°F | Ht 75.0 in | Wt 263.0 lb

## 2015-01-30 DIAGNOSIS — Z09 Encounter for follow-up examination after completed treatment for conditions other than malignant neoplasm: Secondary | ICD-10-CM

## 2015-01-30 DIAGNOSIS — Z114 Encounter for screening for human immunodeficiency virus [HIV]: Secondary | ICD-10-CM

## 2015-01-30 DIAGNOSIS — Z0000 Encounter for general adult medical examination without abnormal findings: Secondary | ICD-10-CM | POA: Diagnosis not present

## 2015-01-30 MED ORDER — ESCITALOPRAM OXALATE 20 MG PO TABS: 20.0000 mg | ORAL_TABLET | Freq: Every day | ORAL | Status: DC

## 2015-01-30 NOTE — Assessment & Plan Note (Signed)
Anxiety, depression: Few months ago we increased Wellbutrin because was feeling groggy and unfocus, he is better but with increased dose of Wellbutrin he is a slightly more anxious. Wonders if could also increase the dose of Lexapro. I agree. Will cont Wellbutrin 100 mg twice a day and Lexapro 20 mg daily reassess in 3 months. Also, I recommend counseling as an important part of the treatment.

## 2015-01-30 NOTE — Progress Notes (Signed)
Subjective:    Patient ID: Larry Yu, male    DOB: 20-Sep-1965, 49 y.o.   MRN: [id number]  DOS:  01/30/2015 Type of visit - description :  CPX Interval history: Medications for depression were adjusted, doing better, still has room for improvement.   Review of Systems  Constitutional: No fever. No chills. No unexplained wt changes. No unusual sweats  HEENT: No dental problems, no ear discharge, no facial swelling, no voice changes. No eye discharge, no eye  redness , no  intolerance to light   Respiratory: No wheezing , no  difficulty breathing. No cough , no mucus production  Cardiovascular: No CP, no leg swelling , no  Palpitations  GI: no nausea, no vomiting, no diarrhea , no  abdominal pain.  No blood in the stools. No dysphagia, no odynophagia    Endocrine: No polyphagia, no polyuria , no polydipsia  GU: No dysuria, gross hematuria, difficulty urinating. No urinary urgency, no frequency.  Musculoskeletal: No joint swellings or unusual aches or pains  Skin: No change in the color of the skin, palor , no  Rash  Allergic, immunologic: No environmental allergies , no  food allergies  Neurological: No dizziness no  syncope. No headaches. No diplopia, no slurred, no slurred speech, no motor deficits, no facial  Numbness  Hematological: No enlarged lymph nodes, no easy bruising , no unusual bleedings  Psychiatry: No suicidal ideas, no hallucinations, no beavior problems, no confusion.     Past Medical History  Diagnosis Date  . Allergic rhinitis   . Depression 12/21/2012  . Hyperlipidemia   . OSA (obstructive sleep apnea) 05-2013    Past Surgical History  Procedure Laterality Date  . No past surgeries      Social History   Social History  . Marital Status: Married    Spouse Name: N/A  . Number of Children: 2  . Years of Education: N/A   Occupational History  . Chemist    Social History Main Topics  . Smoking status: Current Some Day Smoker -- 1.00  packs/day for 13 years    Types: Cigarettes, Cigars    Last Attempt to Quit: 05/18/1993  . Smokeless tobacco: Never Used     Comment: pipe   occasionally.  . Alcohol Use: 0.0 oz/week    0 Standard drinks or equivalent per week     Comment: socially 1-2 drinks ( 2 drinks 3x week)  . Drug Use: No     Comment: QUIT 1995  . Sexual Activity: Not on file   Other Topics Concern  . Not on file   Social History Narrative   Divorced, remarried 2013, divorced again, lives by himself      Family History  Problem Relation Age of Onset  . CAD Neg Hx   . Diabetes Neg Hx   . Colon cancer Neg Hx   . Prostate cancer Neg Hx   . Hypertension Mother   . Stroke Neg Hx   . Dementia Mother     dx age 50      Medication List       This list is accurate as of: 01/30/15  7:25 PM.  Always use your most recent med list.               buPROPion 100 MG tablet  Commonly known as:  WELLBUTRIN  Take 1 tablet (100 mg total) by mouth 2 (two) times daily.     escitalopram 20 MG tablet  Commonly  known as:  LEXAPRO  Take 1 tablet (20 mg total) by mouth daily.     fluticasone 50 MCG/ACT nasal spray  Commonly known as:  FLONASE  INHALE 1 SPRAY INTO THE NOSE TWICE DAILY AS NEEDED FOR RHINITIS.     tadalafil 20 MG tablet  Commonly known as:  CIALIS  Take 0.5-1 tablets (10-20 mg total) by mouth every other day as needed for erectile dysfunction.     valACYclovir 500 MG tablet  Commonly known as:  VALTREX  Take 1 tablet (500 mg total) by mouth 2 (two) times daily.           Objective:   Physical Exam BP 116/76 mmHg  Pulse 52  Temp(Src) 97.9 F (36.6 C) (Oral)  Ht 6\' 3"  (1.905 m)  Wt 263 lb (119.296 kg)  BMI 32.87 kg/m2  SpO2 96% General:   Well developed, well nourished . NAD.  Neck: No thyromegaly HEENT:  Normocephalic . Face symmetric, atraumatic Lungs:  CTA B Normal respiratory effort, no intercostal retractions, no accessory muscle use. Heart: RRR,  no murmur.  no pretibial  edema bilaterally  Abdomen:  Not distended, soft, non-tender. No rebound or rigidity.  Skin: Not pale. Not jaundice Neurologic:  alert & oriented X3.  Speech normal, gait appropriate for age and unassisted Psych--  Cognition and judgment appear intact.  Cooperative with normal attention span and concentration.  Behavior appropriate. No anxious or depressed appearing.    Assessment & Plan:   Problem list > Hyperlipidemia Depression-anxiety   OSA on CPAP Allergic rhinitis  A/P Anxiety, depression: Few months ago we increased Wellbutrin because was feeling groggy and unfocus, he is better but with increased dose of Wellbutrin he is a slightly more anxious. Wonders if could also increase the dose of Lexapro. I agree. Will cont Wellbutrin 100 mg twice a day and Lexapro 20 mg daily reassess in 3 months. Also, I recommend counseling as an important part of the treatment.

## 2015-01-30 NOTE — Patient Instructions (Signed)
Get your blood work before you leave   continue wellbutrin twice a day Leaxpro: increase to 20 mg a day     Next visit  for a  Routine check up in 3 months (15 min) No need to come back fasting

## 2015-01-30 NOTE — Assessment & Plan Note (Signed)
Td 2012; flu shot at work [facility]: Never had a cscope  Prostate ca screening: not indicated  Diet and exercise discussed, room for improvment Labs

## 2015-01-30 NOTE — Progress Notes (Signed)
Pre visit review using our clinic review tool, if applicable. No additional management support is needed unless otherwise documented below in the visit note. 

## 2015-01-31 LAB — LIPID PANEL
Cholesterol: 212 mg/dL — ABNORMAL HIGH (ref 0–200)
HDL: 37.9 mg/dL — AB (ref >39.00)
NonHDL: 173.83
Total CHOL/HDL Ratio: 6
Triglycerides: 231 mg/dL — ABNORMAL HIGH (ref 0.0–149.0)
VLDL: 46.2 mg/dL — AB (ref 0.0–40.0)

## 2015-01-31 LAB — LDL CHOLESTEROL, DIRECT: LDL DIRECT: 128 mg/dL

## 2015-01-31 LAB — TSH: TSH: 1.21 u[IU]/mL (ref 0.35–4.50)

## 2015-01-31 LAB — HIV ANTIBODY (ROUTINE TESTING W REFLEX): HIV 1&2 Ab, 4th Generation: NONREACTIVE

## 2015-02-21 ENCOUNTER — Encounter: Payer: Self-pay | Admitting: Internal Medicine

## 2015-02-21 ENCOUNTER — Ambulatory Visit (INDEPENDENT_AMBULATORY_CARE_PROVIDER_SITE_OTHER): Payer: BLUE CROSS/BLUE SHIELD | Admitting: Internal Medicine

## 2015-02-21 VITALS — BP 134/80 | HR 64 | Ht 75.0 in | Wt 265.2 lb

## 2015-02-21 DIAGNOSIS — J302 Other seasonal allergic rhinitis: Secondary | ICD-10-CM | POA: Diagnosis not present

## 2015-02-21 DIAGNOSIS — G4733 Obstructive sleep apnea (adult) (pediatric): Secondary | ICD-10-CM

## 2015-02-21 NOTE — Progress Notes (Signed)
06/18/14- Dr Gwenette Greet HPI The patient comes in today for follow-up of his obstructive sleep apnea. Started on C Pap at the last visit, and is done very well with his device. His download shows an excellent compliance, and good control of his AHI. He is not having any mask leak issues, but has had some soreness from the nasal pillows. He feels that he is sleeping very well with the device, and has seen great improvement in his daytime alertness and energy level.  02/21/15- 26 yoM smoker followed for OSA Former pt of [place]; DME is [facility]; Wears CPAP auto 5-20/APS every night for about 6-7 hours; pressure works well for patient. Order new mask. Using nasal pillows mask. CPAP auto 5-20/ [facility] Gets flu vaccine at work Noticing increased nasal congestion and drainage associated with ragweed  ROS-see HPI   Negative unless "+" Constitutional:    weight loss, night sweats, fevers, chills, fatigue, lassitude. HEENT:    headaches, difficulty swallowing, tooth/dental problems, sore throat,       sneezing, itching, ear ache, nasal congestion, post nasal drip, snoring CV:    chest pain, orthopnea, PND, swelling in lower extremities, anasarca,                                                             dizziness, palpitations Resp:   shortness of breath with exertion or at rest.                productive cough,   non-productive cough, coughing up of blood.              change in color of mucus.  wheezing.   Skin:    rash or lesions. GI:  No-   heartburn, indigestion, abdominal pain, nausea, vomiting,  GU:  MS:   joint pain, stiffness,  Neuro-     nothing unusual Psych:  change in mood or affect.  depression or anxiety.   memory loss.  OBJ- Physical Exam General- Alert, Oriented, Affect-appropriate, Distress- none acute, big man Skin- rash-none, lesions- none, excoriation- none Lymphadenopathy- none Head- atraumatic            Eyes- Gross vision intact, PERRLA, conjunctivae and secretions clear            Ears-  Hearing, canals-normal            Nose- Clear, no-Septal dev, mucus, polyps, erosion, perforation             Throat- Mallampati III , mucosa clear , drainage- none, tonsils- atrophic Neck- flexible , trachea midline, no stridor , thyroid nl, carotid no bruit Chest - symmetrical excursion , unlabored           Heart/CV- RRR , no murmur , no gallop  , no rub, nl s1 s2                           - JVD- none , edema- none, stasis changes- none, varices- none           Lung- clear to P&A, wheeze- none, cough- none , dullness-none, rub- none           Chest wall-  Abd-  Br/ Gen/ Rectal- Not done, not indicated Extrem- cyanosis- none, clubbing, none, atrophy-  none, strength- nl Neuro- grossly intact to observation

## 2015-02-21 NOTE — Patient Instructions (Signed)
Order- DME APS   Replacement CPAP mask of choice and supplies, continue CPAP auto 5-20, humidifier    Dx OSA

## 2015-02-23 NOTE — Assessment & Plan Note (Signed)
Suggested nonsedating OTC antihistamine or nasal steroid spray

## 2015-02-23 NOTE — Assessment & Plan Note (Signed)
He is satisfied to continue current CPAP settings. He describes good compliance and indicates that he sleeps better and quality of life is better with CPAP.

## 2015-04-24 ENCOUNTER — Ambulatory Visit (INDEPENDENT_AMBULATORY_CARE_PROVIDER_SITE_OTHER): Payer: BLUE CROSS/BLUE SHIELD | Admitting: Internal Medicine

## 2015-04-24 ENCOUNTER — Encounter: Payer: Self-pay | Admitting: Internal Medicine

## 2015-04-24 VITALS — BP 116/62 | HR 61 | Temp 98.1°F | Ht 75.0 in | Wt 264.2 lb

## 2015-04-24 DIAGNOSIS — F329 Major depressive disorder, single episode, unspecified: Secondary | ICD-10-CM

## 2015-04-24 DIAGNOSIS — Z23 Encounter for immunization: Secondary | ICD-10-CM

## 2015-04-24 DIAGNOSIS — Z09 Encounter for follow-up examination after completed treatment for conditions other than malignant neoplasm: Secondary | ICD-10-CM

## 2015-04-24 DIAGNOSIS — F909 Attention-deficit hyperactivity disorder, unspecified type: Secondary | ICD-10-CM

## 2015-04-24 DIAGNOSIS — F32A Depression, unspecified: Secondary | ICD-10-CM

## 2015-04-24 DIAGNOSIS — F988 Other specified behavioral and emotional disorders with onset usually occurring in childhood and adolescence: Secondary | ICD-10-CM

## 2015-04-24 NOTE — Progress Notes (Signed)
Pre visit review using our clinic review tool, if applicable. No additional management support is needed unless otherwise documented below in the visit note. 

## 2015-04-24 NOTE — Progress Notes (Signed)
Subjective:    Patient ID: Larry Yu, male    DOB: 02/25/1966, 49 y.o.   MRN: [id number]  DOS:  04/24/2015 Type of visit - description : Follow-up previous visit Interval history:  Anxiety depression: Since the last time he was here he decrease the dose of Wellbutrin and Lexapro and currently is doing well. Continue with difficulty concentrating, this is going on for a while, he thinks he has ADD.    Review of Systems   Past Medical History  Diagnosis Date  . Allergic rhinitis   . Depression 12/21/2012  . Hyperlipidemia   . OSA (obstructive sleep apnea) 05-2013    Past Surgical History  Procedure Laterality Date  . No past surgeries      Social History   Social History  . Marital Status: Married    Spouse Name: N/A  . Number of Children: 2  . Years of Education: N/A   Occupational History  . Chemist    Social History Main Topics  . Smoking status: Current Some Day Smoker -- 1.00 packs/day for 13 years    Types: Cigarettes, Cigars    Last Attempt to Quit: 05/18/1993  . Smokeless tobacco: Never Used     Comment: pipe   occasionally.  . Alcohol Use: 0.0 oz/week    0 Standard drinks or equivalent per week     Comment: socially 1-2 drinks ( 2 drinks 3x week)  . Drug Use: No     Comment: QUIT 1995  . Sexual Activity: Not on file   Other Topics Concern  . Not on file   Social History Narrative   Divorced, remarried 2013, divorced again, lives by himself         Medication List       This list is accurate as of: 04/24/15 11:59 PM.  Always use your most recent med list.               buPROPion 100 MG tablet  Commonly known as:  WELLBUTRIN  Take 100 mg by mouth daily.     escitalopram 20 MG tablet  Commonly known as:  LEXAPRO  Take 10 mg by mouth daily.     fluticasone 50 MCG/ACT nasal spray  Commonly known as:  FLONASE  INHALE 1 SPRAY INTO THE NOSE TWICE DAILY AS NEEDED FOR RHINITIS.     tadalafil 20 MG tablet  Commonly known as:  CIALIS    Take 0.5-1 tablets (10-20 mg total) by mouth every other day as needed for erectile dysfunction.     valACYclovir 500 MG tablet  Commonly known as:  VALTREX  Take 1 tablet (500 mg total) by mouth 2 (two) times daily.           Objective:   Physical Exam BP 116/62 mmHg  Pulse 61  Temp(Src) 98.1 F (36.7 C) (Oral)  Ht 6\' 3"  (1.905 m)  Wt 264 lb 4 oz (119.863 kg)  BMI 33.03 kg/m2  SpO2 96% General:   Well developed, well nourished . NAD.  HEENT:  Normocephalic . Face symmetric, atraumatic Neurologic:  alert & oriented X3.  Speech normal, gait appropriate for age and unassisted Psych--  Cognition and judgment appear intact.  Cooperative with normal attention span and concentration.  Behavior appropriate. No anxious or apprehensive today, looks better than on previous visits     Assessment & Plan:   Assessment > Hyperlipidemia Depression-anxiety   ?ADD OSA on CPAP Allergic rhinitis  PLAN: Anxiety depression: Self decrease Wellbutrin  to 100 mg daily and Lexapro 10 mg daily. Currently feeling well. ADD? Continued to feel like he has some degree of ADD, never been formally tested or treated with any medication. Request a referral ---> to [place] [facility]. RTC 6 months

## 2015-04-24 NOTE — Patient Instructions (Signed)
Will refer you to a a specialist, ADD?  Continue same medicines  Next visit 6-8 months

## 2015-04-25 NOTE — Assessment & Plan Note (Signed)
Anxiety depression: Self decrease Wellbutrin to 100 mg daily and Lexapro 10 mg daily. Currently feeling well. ADD? Continued to feel like he has some degree of ADD, never been formally tested or treated with any medication. Request a referral ---> to [place] [facility]. RTC 6 months

## 2015-08-12 ENCOUNTER — Other Ambulatory Visit: Payer: Self-pay

## 2015-08-19 DIAGNOSIS — M9905 Segmental and somatic dysfunction of pelvic region: Secondary | ICD-10-CM | POA: Diagnosis not present

## 2015-08-19 DIAGNOSIS — M791 Myalgia: Secondary | ICD-10-CM | POA: Diagnosis not present

## 2015-08-19 DIAGNOSIS — M9904 Segmental and somatic dysfunction of sacral region: Secondary | ICD-10-CM | POA: Diagnosis not present

## 2015-08-19 DIAGNOSIS — M9903 Segmental and somatic dysfunction of lumbar region: Secondary | ICD-10-CM | POA: Diagnosis not present

## 2015-08-28 DIAGNOSIS — M791 Myalgia: Secondary | ICD-10-CM | POA: Diagnosis not present

## 2015-08-28 DIAGNOSIS — M9904 Segmental and somatic dysfunction of sacral region: Secondary | ICD-10-CM | POA: Diagnosis not present

## 2015-08-28 DIAGNOSIS — M9903 Segmental and somatic dysfunction of lumbar region: Secondary | ICD-10-CM | POA: Diagnosis not present

## 2015-08-28 DIAGNOSIS — M9905 Segmental and somatic dysfunction of pelvic region: Secondary | ICD-10-CM | POA: Diagnosis not present

## 2015-09-17 DIAGNOSIS — G4733 Obstructive sleep apnea (adult) (pediatric): Secondary | ICD-10-CM | POA: Diagnosis not present

## 2015-10-01 DIAGNOSIS — M545 Low back pain: Secondary | ICD-10-CM | POA: Diagnosis not present

## 2015-10-01 DIAGNOSIS — M5442 Lumbago with sciatica, left side: Secondary | ICD-10-CM | POA: Diagnosis not present

## 2015-10-10 DIAGNOSIS — F902 Attention-deficit hyperactivity disorder, combined type: Secondary | ICD-10-CM | POA: Diagnosis not present

## 2015-10-10 DIAGNOSIS — Z79899 Other long term (current) drug therapy: Secondary | ICD-10-CM | POA: Diagnosis not present

## 2015-10-23 ENCOUNTER — Encounter: Payer: Self-pay | Admitting: Internal Medicine

## 2015-10-23 ENCOUNTER — Ambulatory Visit (INDEPENDENT_AMBULATORY_CARE_PROVIDER_SITE_OTHER): Payer: BLUE CROSS/BLUE SHIELD | Admitting: Internal Medicine

## 2015-10-23 VITALS — BP 118/74 | HR 55 | Temp 98.0°F | Ht 75.0 in | Wt 245.1 lb

## 2015-10-23 DIAGNOSIS — F32A Depression, unspecified: Secondary | ICD-10-CM

## 2015-10-23 DIAGNOSIS — F988 Other specified behavioral and emotional disorders with onset usually occurring in childhood and adolescence: Secondary | ICD-10-CM

## 2015-10-23 DIAGNOSIS — F909 Attention-deficit hyperactivity disorder, unspecified type: Secondary | ICD-10-CM

## 2015-10-23 DIAGNOSIS — F329 Major depressive disorder, single episode, unspecified: Secondary | ICD-10-CM

## 2015-10-23 NOTE — Progress Notes (Signed)
Pre visit review using our clinic review tool, if applicable. No additional management support is needed unless otherwise documented below in the visit note. 

## 2015-10-23 NOTE — Patient Instructions (Signed)
GO TO THE FRONT DESK Schedule your next appointment for a  Physical exam by September or October -2017 , fasting

## 2015-10-23 NOTE — Progress Notes (Signed)
Subjective:    Patient ID: Larry Yu, male    DOB: 10/13/1965, 50 y.o.   MRN: [id number]  DOS:  10/23/2015 Type of visit - description : Routine checkup Interval history: Since the last visit, he was diagnosed and treated for ADD, good medication compliance, he definitely feels he is more focused. Also, few months ago developed back pain, left-sided, + radiation to the leg, went to a chiropractor with no improvement, eventually saw an orthopedic doctor, prescribed prednisone, better but not completely recovered.   Review of Systems Denies increase in anxiety or depression. No insomnia No lower extremity paresthesias, bowel or bladder incontinence  Past Medical History  Diagnosis Date  . Allergic rhinitis   . Depression 12/21/2012  . Hyperlipidemia   . OSA (obstructive sleep apnea) 05-2013  . ADHD (attention deficit hyperactivity disorder)     see's Dr. Johnnye Sima    Past Surgical History  Procedure Laterality Date  . No past surgeries      Social History   Social History  . Marital Status: Married    Spouse Name: N/A  . Number of Children: 2  . Years of Education: N/A   Occupational History  . Chemist    Social History Main Topics  . Smoking status: Current Some Day Smoker -- 1.00 packs/day for 13 years    Types: Cigarettes, Cigars    Last Attempt to Quit: 05/18/1993  . Smokeless tobacco: Never Used     Comment: pipe   occasionally.  . Alcohol Use: 0.0 oz/week    0 Standard drinks or equivalent per week     Comment: socially 1-2 drinks ( 2 drinks 3x week)  . Drug Use: No     Comment: QUIT 1995  . Sexual Activity: Not on file   Other Topics Concern  . Not on file   Social History Narrative   Divorced, remarried 2013, divorced again, lives by himself         Medication List       This list is accurate as of: 10/23/15 11:59 PM.  Always use your most recent med list.               ADZENYS XR-ODT 18.8 MG Tbed  Generic drug:  Amphetamine ER  Take 1  tablet by mouth daily.     buPROPion 100 MG tablet  Commonly known as:  WELLBUTRIN  Take 100 mg by mouth daily.     escitalopram 20 MG tablet  Commonly known as:  LEXAPRO  Take 10 mg by mouth daily.     tadalafil 20 MG tablet  Commonly known as:  CIALIS  Take 0.5-1 tablets (10-20 mg total) by mouth every other day as needed for erectile dysfunction.     valACYclovir 500 MG tablet  Commonly known as:  VALTREX  Take 1 tablet (500 mg total) by mouth 2 (two) times daily.           Objective:   Physical Exam BP 118/74 mmHg  Pulse 55  Temp(Src) 98 F (36.7 C) (Oral)  Ht 6\' 3"  (1.905 m)  Wt 245 lb 2 oz (111.188 kg)  BMI 30.64 kg/m2  SpO2 97% General:   Well developed, well nourished . NAD.  HEENT:  Normocephalic . Face symmetric, atraumatic Lungs:  CTA B Normal respiratory effort, no intercostal retractions, no accessory muscle use. Heart: RRR,  no murmur.  No pretibial edema bilaterally  MSK: No TTP at the lower back Skin: Not pale. Not jaundice Neurologic:  alert & oriented X3.  Speech normal, gait appropriate for age and unassisted Psych--  Cognition and judgment appear intact.  Cooperative with normal attention span and concentration.  Behavior appropriate. No anxious or depressed appearing.      Assessment & Plan:   Assessment > Hyperlipidemia Depression-anxiety   ADD - formally dx by Dr Johnnye Sima  and started meds ~ 05-2015 OSA on CPAP Allergic rhinitis  PLAN: Anxiety depression: Continue Lexapro, Wellbutrin. Ccontrolled ADD: DX suspected for a while per patient and confirmed by Dr. Johnnye Sima, started medications, feeling very well. Back pain: See HPI, has back pain, if he is not back to normal in few days he will call his orthopedic doctor (or me), may need a referral for PT since previous episodes of back pain responded well to therapy  RTC 01-2016, CPX

## 2015-10-24 DIAGNOSIS — F988 Other specified behavioral and emotional disorders with onset usually occurring in childhood and adolescence: Secondary | ICD-10-CM | POA: Insufficient documentation

## 2015-10-24 NOTE — Assessment & Plan Note (Signed)
Anxiety depression: Continue Lexapro, Wellbutrin. Ccontrolled ADD: DX suspected for a while per patient and confirmed by Dr. Johnnye Sima, started medications, feeling very well. Back pain: See HPI, has back pain, if he is not back to normal in few days he will call his orthopedic doctor (or me), may need a referral for PT since previous episodes of back pain responded well to therapy  RTC 01-2016, CPX

## 2015-12-30 ENCOUNTER — Telehealth: Payer: Self-pay | Admitting: Internal Medicine

## 2015-12-30 MED ORDER — BUPROPION HCL 100 MG PO TABS: 100.0000 mg | ORAL_TABLET | Freq: Every day | ORAL | 5 refills | Status: DC

## 2015-12-30 NOTE — Telephone Encounter (Signed)
°  Relationship to patient: Self  Can be reached: [phone]   Pharmacy:  [facility], [address] [phone] (Phone) [phone] (Fax)     Reason for call: Request refill on buPROPion [facility]) 100 MG tablet [id number]

## 2015-12-30 NOTE — Telephone Encounter (Signed)
Rx sent 

## 2016-02-05 ENCOUNTER — Encounter: Payer: BLUE CROSS/BLUE SHIELD | Admitting: Internal Medicine

## 2016-02-05 ENCOUNTER — Telehealth: Payer: Self-pay | Admitting: Internal Medicine

## 2016-02-05 NOTE — Telephone Encounter (Signed)
No, thx 

## 2016-02-05 NOTE — Telephone Encounter (Signed)
Patient left message on voicemail @ 8:38 stating he could not make 9:00 appointment. Charge or No Charge?

## 2016-02-06 ENCOUNTER — Other Ambulatory Visit: Payer: Self-pay

## 2016-02-06 MED ORDER — ESCITALOPRAM OXALATE 20 MG PO TABS: 10.0000 mg | ORAL_TABLET | Freq: Every day | ORAL | 1 refills | Status: DC

## 2016-02-21 ENCOUNTER — Ambulatory Visit (INDEPENDENT_AMBULATORY_CARE_PROVIDER_SITE_OTHER): Payer: BLUE CROSS/BLUE SHIELD | Admitting: Internal Medicine

## 2016-02-21 ENCOUNTER — Encounter: Payer: Self-pay | Admitting: Internal Medicine

## 2016-02-21 DIAGNOSIS — F5101 Primary insomnia: Secondary | ICD-10-CM

## 2016-02-21 DIAGNOSIS — G4733 Obstructive sleep apnea (adult) (pediatric): Secondary | ICD-10-CM | POA: Diagnosis not present

## 2016-02-21 DIAGNOSIS — G4700 Insomnia, unspecified: Secondary | ICD-10-CM | POA: Insufficient documentation

## 2016-02-21 MED ORDER — ZALEPLON 10 MG PO CAPS
ORAL_CAPSULE | ORAL | 5 refills | Status: DC
Start: 2016-02-21 — End: 2016-09-16

## 2016-02-21 NOTE — Patient Instructions (Signed)
Script printed for [employer] to help fall asleep if needed  Order- DME [facility]  Please change auto PAP range to 4-15, continue mask of choice, humidifier, supplies, AirView   Dx OSA  Please call if we can help

## 2016-02-21 NOTE — Progress Notes (Signed)
06/18/14- Dr Gwenette Greet HPI The patient comes in today for follow-up of his obstructive sleep apnea. Started on C Pap at the last visit, and is done very well with his device. His download shows an excellent compliance, and good control of his AHI. He is not having any mask leak issues, but has had some soreness from the nasal pillows. He feels that he is sleeping very well with the device, and has seen great improvement in his daytime alertness and energy level.  02/21/15- 59 yoM smoker followed for OSA Former pt of [place]; DME is [facility]; Wears CPAP auto 5-20/APS every night for about 6-7 hours; pressure works well for patient. Order new mask. Using nasal pillows mask. CPAP auto 5-20/ [facility] Gets flu vaccine at work Noticing increased nasal congestion and drainage associated with ragweed  02/21/2016-50 year old male smoker followed for OSA CPAP auto 5-20/APS> this visit changed to 4-15 FOLLOWS FOR:DME: [facility]. Pt states he is trying to wear CPAP most everynight; pt has not been sleeping much lately with or without the CPAP. DL attached-takes CPAP off through the night due to pressure too high. Work stress with difficulty initiating sleep some nights. Amphetamine for ADHD has also helped with 20 pound weight loss. He only takes it in the morning to avoid sleep disturbance. Download shows inadequate compliance 40%/4 hours with good control AHI 0.8/hour. He would like to start with lower pressure and not go as high.  ROS-see HPI   Negative unless "+" Constitutional:    weight loss, night sweats, fevers, chills, fatigue, lassitude. HEENT:    headaches, difficulty swallowing, tooth/dental problems, sore throat,       sneezing, itching, ear ache, nasal congestion, post nasal drip, snoring CV:    chest pain, orthopnea, PND, swelling in lower extremities, anasarca,                                                             dizziness, palpitations Resp:   shortness of breath with exertion or at rest.    productive cough,   non-productive cough, coughing up of blood.              change in color of mucus.  wheezing.   Skin:    rash or lesions. GI:  No-   heartburn, indigestion, abdominal pain, nausea, vomiting,  GU:  MS:   joint pain, stiffness,  Neuro-     nothing unusual Psych:  change in mood or affect.  depression or anxiety.   memory loss.  OBJ- Physical Exam General- Alert, Oriented, Affect-appropriate, Distress- none acute, big man Skin- rash-none, lesions- none, excoriation- none Lymphadenopathy- none Head- atraumatic            Eyes- Gross vision intact, PERRLA, conjunctivae and secretions clear            Ears- Hearing, canals-normal            Nose- Clear, no-Septal dev, mucus, polyps, erosion, perforation             Throat- Mallampati III , mucosa clear , drainage- none, tonsils- atrophic Neck- flexible , trachea midline, no stridor , thyroid nl, carotid no bruit Chest - symmetrical excursion , unlabored           Heart/CV- RRR , no murmur , no gallop  ,  no rub, nl s1 s2                           - JVD- none , edema- none, stasis changes- none, varices- none           Lung- clear to P&A, wheeze- none, cough- none , dullness-none, rub- none           Chest wall-  Abd-  Br/ Gen/ Rectal- Not done, not indicated Extrem- cyanosis- none, clubbing, none, atrophy- none, strength- nl Neuro- grossly intact to observation

## 2016-02-21 NOTE — Assessment & Plan Note (Signed)
We discussed compliance and goals. Plan-DME APS change auto Pap range to 4-15

## 2016-02-21 NOTE — Assessment & Plan Note (Signed)
He associates difficulty falling asleep with work stress and not with his morning amphetamine dose. Sleep hygiene reviewed. Plan-okay to try occasional Sonata if needed

## 2016-03-04 ENCOUNTER — Encounter: Payer: Self-pay | Admitting: Internal Medicine

## 2016-03-24 DIAGNOSIS — F419 Anxiety disorder, unspecified: Secondary | ICD-10-CM | POA: Diagnosis not present

## 2016-03-24 DIAGNOSIS — Z79899 Other long term (current) drug therapy: Secondary | ICD-10-CM | POA: Diagnosis not present

## 2016-03-24 DIAGNOSIS — G4733 Obstructive sleep apnea (adult) (pediatric): Secondary | ICD-10-CM | POA: Diagnosis not present

## 2016-03-24 DIAGNOSIS — F902 Attention-deficit hyperactivity disorder, combined type: Secondary | ICD-10-CM | POA: Diagnosis not present

## 2016-04-29 ENCOUNTER — Encounter: Payer: Self-pay | Admitting: Internal Medicine

## 2016-04-29 ENCOUNTER — Ambulatory Visit (INDEPENDENT_AMBULATORY_CARE_PROVIDER_SITE_OTHER): Payer: BLUE CROSS/BLUE SHIELD | Admitting: Internal Medicine

## 2016-04-29 VITALS — BP 108/78 | HR 62 | Temp 97.8°F | Resp 14 | Ht 75.0 in | Wt 244.0 lb

## 2016-04-29 DIAGNOSIS — K591 Functional diarrhea: Secondary | ICD-10-CM

## 2016-04-29 DIAGNOSIS — Z0000 Encounter for general adult medical examination without abnormal findings: Secondary | ICD-10-CM

## 2016-04-29 DIAGNOSIS — Z23 Encounter for immunization: Secondary | ICD-10-CM | POA: Diagnosis not present

## 2016-04-29 DIAGNOSIS — R399 Unspecified symptoms and signs involving the genitourinary system: Secondary | ICD-10-CM | POA: Diagnosis not present

## 2016-04-29 MED ORDER — TADALAFIL 20 MG PO TABS: 10.0000 mg | ORAL_TABLET | ORAL | 5 refills | Status: DC | PRN

## 2016-04-29 NOTE — Assessment & Plan Note (Addendum)
Td 2012; flu shot  today CCS: Never had a cscope  Prostate ca screening: DRE done today for LUTS: wnl. Will get a UA, urine culture and PSA . Trial w/ flomax if labs ok Diet and exercise discussed. Labs   : CMP, FLP, CBC.

## 2016-04-29 NOTE — Progress Notes (Signed)
Pre visit review using our clinic review tool, if applicable. No additional management support is needed unless otherwise documented below in the visit note. 

## 2016-04-29 NOTE — Progress Notes (Signed)
Subjective:    Patient ID: Larry Yu, male    DOB: January 29, 1966, 50 y.o.   MRN: [id number]  DOS:  04/29/2016 Type of visit - description : cpx Interval history: Here for a CPX, good medication compliance, he does have some concerns.   Review of Systems Constitutional: No fever. No chills. No unexplained wt changes. No unusual sweats  HEENT: No dental problems, no ear discharge, no facial swelling, no voice changes. No eye discharge, no eye  redness , no  intolerance to light   Respiratory: No wheezing , no  difficulty breathing. No cough , no mucus production  Cardiovascular: No CP, no leg swelling , no  Palpitations  GI:  Loose stools 80% of the time, previously seen by GI, was recommended colonoscopy but did not proceed. no nausea, no vomiting, does have occasional abdominal pain.  No blood in the stools. No dysphagia, no odynophagia    Endocrine: No polyphagia, no polyuria , no polydipsia  GU:  Has noted some urinary difficulty, consistently. Sometimes has urinary urgency or frequency. Stream seems weak compared to a few years ago. Denies gross hematuria, dysuria, flank pain.  Musculoskeletal: No joint swellings or unusual aches or pains  Skin: No change in the color of the skin, palor , no  Rash  Allergic, immunologic: No environmental allergies , no  food allergies  Neurological: No dizziness no  syncope. No headaches. No diplopia, no slurred, no slurred speech, no motor deficits, no facial  Numbness  Hematological: No enlarged lymph nodes, no easy bruising , no unusual bleedings  Psychiatry: No suicidal ideas, no hallucinations, no beavior problems, no confusion.  No unusual/severe anxiety, no depression   Past Medical History:  Diagnosis Date  . ADHD (attention deficit hyperactivity disorder)    see's Dr. Johnnye Sima  . Allergic rhinitis   . Depression 12/21/2012  . Hyperlipidemia   . OSA (obstructive sleep apnea) 05-2013    Past Surgical History:    Procedure Laterality Date  . NO PAST SURGERIES      Social History   Social History  . Marital status: Divorced    Spouse name: N/A  . Number of children: 2  . Years of education: N/A   Occupational History  . [employer]   Social History Main Topics  . Smoking status: Former Smoker    Packs/day: 1.00    Years: 13.00    Types: Cigarettes, Cigars    Quit date: 05/18/1993  . Smokeless tobacco: Never Used     Comment: vaping   . Alcohol use 0.0 oz/week     Comment: socially 1-2 drinks ( 2 drinks 3x week)  . Drug use: No     Comment: QUIT 1995  . Sexual activity: Not on file   Other Topics Concern  . Not on file   Social History Narrative   Divorced, remarried 2013, divorced again, lives by himself      Family History  Problem Relation Age of Onset  . Hypertension Mother   . Dementia Mother     dx age 61  . CAD Neg Hx   . Diabetes Neg Hx   . Colon cancer Neg Hx   . Prostate cancer Neg Hx   . Stroke Neg Hx        Medication List       Accurate as of 04/29/16 11:59 PM. Always use your most recent med list.          ADZENYS XR-ODT 18.8 MG Tbed  Generic drug:  Amphetamine ER Take 1 tablet by mouth daily.   buPROPion 100 MG tablet Commonly known as:  WELLBUTRIN Take 1 tablet (100 mg total) by mouth daily.   escitalopram 20 MG tablet Commonly known as:  LEXAPRO Take 0.5 tablets (10 mg total) by mouth daily.   tadalafil 20 MG tablet Commonly known as:  CIALIS Take 0.5-1 tablets (10-20 mg total) by mouth every other day as needed for erectile dysfunction.   valACYclovir 500 MG tablet Commonly known as:  VALTREX Take 1 tablet (500 mg total) by mouth 2 (two) times daily.   zaleplon 10 MG capsule Commonly known as:  SONATA 1 for sleep if needed          Objective:   Physical Exam BP 108/78 (BP Location: Left Arm, Patient Position: Sitting, Cuff Size: Normal)   Pulse 62   Temp 97.8 F (36.6 C) (Oral)   Resp 14   Ht 6\' 3"  (1.905 m)   Wt  244 lb (110.7 kg)   SpO2 98%   BMI 30.50 kg/m   General:   Well developed, well nourished . NAD.  Neck: No  thyromegaly  HEENT:  Normocephalic . Face symmetric, atraumatic Lungs:  CTA B Normal respiratory effort, no intercostal retractions, no accessory muscle use. Heart: RRR,  no murmur.  No pretibial edema bilaterally  Abdomen:  Not distended, soft, non-tender. No rebound or rigidity Rectal:  External abnormalities: none. Normal sphincter tone. No rectal masses or tenderness.  Stool not found  Prostate: Prostate gland firm and smooth, no enlargement, nodularity, tenderness, mass, asymmetry or induration.  Skin: Exposed areas without rash. Not pale. Not jaundice Neurologic:  alert & oriented X3.  Speech normal, gait appropriate for age and unassisted Strength symmetric and appropriate for age.  Psych: Cognition and judgment appear intact.  Cooperative with normal attention span and concentration.  Behavior appropriate. No anxious or depressed appearing.    Assessment & Plan:   Assessment  Hyperlipidemia Depression-anxiety   ADD - formally dx by Dr Johnnye Sima  and started meds ~ 05-2015, f/u by [facility]  OSA on CPAP , Dr Annamaria Boots , rarely takes sonata HSV  Allergic rhinitis  PLAN: LUTS: As described above, DRE normal, get a PSA, UA urine culture. Discussed medications: will do a trial w/ flomax if labs negative. Hyperlipidemia: Diet control, checking labs Depression, anxiety, ADD: Well-controlled Chronic diarrhea: Saw GI last year, offered a colonoscopy, did not proceed. Re-refer to GI RF cialis RTC one year and as needed

## 2016-04-29 NOTE — Patient Instructions (Signed)
GO TO THE LAB : Get the blood work and a urine sample    GO TO THE FRONT DESK Schedule your next appointment for a  Physical exam in 1 year

## 2016-04-30 LAB — URINALYSIS, ROUTINE W REFLEX MICROSCOPIC
Bilirubin Urine: NEGATIVE
Hgb urine dipstick: NEGATIVE
Ketones, ur: NEGATIVE
Leukocytes, UA: NEGATIVE
Nitrite: NEGATIVE
PH: 5.5 (ref 5.0–8.0)
TOTAL PROTEIN, URINE-UPE24: NEGATIVE
Urine Glucose: NEGATIVE
Urobilinogen, UA: 0.2 (ref 0.0–1.0)

## 2016-04-30 LAB — LIPID PANEL
CHOL/HDL RATIO: 5
CHOLESTEROL: 218 mg/dL — AB (ref 0–200)
HDL: 42.9 mg/dL (ref >39.00)
NONHDL: 175.35
TRIGLYCERIDES: 206 mg/dL — AB (ref 0.0–149.0)
VLDL: 41.2 mg/dL — ABNORMAL HIGH (ref 0.0–40.0)

## 2016-04-30 LAB — COMPREHENSIVE METABOLIC PANEL
ALBUMIN: 4.7 g/dL (ref 3.5–5.2)
ALT: 35 U/L (ref 0–53)
AST: 19 U/L (ref 0–37)
Alkaline Phosphatase: 62 U/L (ref 39–117)
BILIRUBIN TOTAL: 0.5 mg/dL (ref 0.2–1.2)
BUN: 15 mg/dL (ref 6–23)
CALCIUM: 9.7 mg/dL (ref 8.4–10.5)
CHLORIDE: 103 meq/L (ref 96–112)
CO2: 32 mEq/L (ref 19–32)
CREATININE: 1.17 mg/dL (ref 0.40–1.50)
GFR: 70.43 mL/min (ref >60.00)
Glucose, Bld: 91 mg/dL (ref 70–99)
Potassium: 4.5 mEq/L (ref 3.5–5.1)
SODIUM: 142 meq/L (ref 135–145)
Total Protein: 7 g/dL (ref 6.0–8.3)

## 2016-04-30 LAB — LDL CHOLESTEROL, DIRECT: LDL DIRECT: 136 mg/dL

## 2016-04-30 LAB — URINE CULTURE: Organism ID, Bacteria: NO GROWTH

## 2016-04-30 LAB — PSA: PSA: 0.48 ng/mL (ref 0.10–4.00)

## 2016-04-30 NOTE — Assessment & Plan Note (Signed)
LUTS: As described above, DRE normal, get a PSA, UA urine culture. Discussed medications: will do a trial w/ flomax if labs negative. Hyperlipidemia: Diet control, checking labs Depression, anxiety, ADD: Well-controlled Chronic diarrhea: Saw GI last year, offered a colonoscopy, did not proceed. Re-refer to GI RF cialis RTC one year and as needed

## 2016-05-01 LAB — CBC WITH DIFFERENTIAL/PLATELET
BASOS ABS: 0.1 10*3/uL (ref 0.0–0.1)
Basophils Relative: 0.8 % (ref 0.0–3.0)
EOS PCT: 3.1 % (ref 0.0–5.0)
Eosinophils Absolute: 0.2 10*3/uL (ref 0.0–0.7)
HEMATOCRIT: 46 % (ref 39.0–52.0)
Hemoglobin: 15.6 g/dL (ref 13.0–17.0)
LYMPHS ABS: 1.9 10*3/uL (ref 0.7–4.0)
LYMPHS PCT: 26.4 % (ref 12.0–46.0)
MCHC: 34 g/dL (ref 30.0–36.0)
MCV: 88.8 fl (ref 78.0–100.0)
MONOS PCT: 8.3 % (ref 3.0–12.0)
Monocytes Absolute: 0.6 10*3/uL (ref 0.1–1.0)
NEUTROS ABS: 4.5 10*3/uL (ref 1.4–7.7)
Neutrophils Relative %: 61.4 % (ref 43.0–77.0)
Platelets: 218 10*3/uL (ref 150.0–400.0)
RBC: 5.17 Mil/uL (ref 4.22–5.81)
RDW: 13.7 % (ref 11.5–15.5)
WBC: 7.3 10*3/uL (ref 4.0–10.5)

## 2016-05-04 MED ORDER — TAMSULOSIN HCL 0.4 MG PO CAPS: 0.4000 mg | ORAL_CAPSULE | Freq: Every day | ORAL | 6 refills | Status: DC

## 2016-05-04 NOTE — Addendum Note (Signed)
Addended byDamita Dunnings D on: 05/04/2016 01:39 PM   Modules accepted: Orders

## 2016-08-12 DIAGNOSIS — Z79899 Other long term (current) drug therapy: Secondary | ICD-10-CM | POA: Diagnosis not present

## 2016-08-12 DIAGNOSIS — F419 Anxiety disorder, unspecified: Secondary | ICD-10-CM | POA: Diagnosis not present

## 2016-08-12 DIAGNOSIS — F902 Attention-deficit hyperactivity disorder, combined type: Secondary | ICD-10-CM | POA: Diagnosis not present

## 2016-08-12 DIAGNOSIS — G4733 Obstructive sleep apnea (adult) (pediatric): Secondary | ICD-10-CM | POA: Diagnosis not present

## 2016-09-16 ENCOUNTER — Other Ambulatory Visit: Payer: Self-pay | Admitting: Internal Medicine

## 2016-09-16 NOTE — Telephone Encounter (Signed)
CY Please advise on refill. Thanks.  

## 2016-09-17 NOTE — Telephone Encounter (Signed)
Ok to refill 

## 2016-09-18 NOTE — Telephone Encounter (Signed)
Rx was called to pharm 

## 2016-09-21 ENCOUNTER — Other Ambulatory Visit: Payer: Self-pay

## 2016-09-21 ENCOUNTER — Encounter: Payer: Self-pay | Admitting: Internal Medicine

## 2016-09-21 DIAGNOSIS — N529 Male erectile dysfunction, unspecified: Secondary | ICD-10-CM

## 2016-09-21 MED ORDER — TADALAFIL 20 MG PO TABS: 10.0000 mg | ORAL_TABLET | ORAL | 5 refills | Status: DC | PRN

## 2016-09-22 MED ORDER — TADALAFIL 20 MG PO TABS: 10.0000 mg | ORAL_TABLET | ORAL | 0 refills | Status: DC | PRN

## 2016-09-22 NOTE — Addendum Note (Signed)
Addended byDamita Dunnings D on: 09/22/2016 01:39 PM   Modules accepted: Orders

## 2016-09-25 NOTE — Telephone Encounter (Signed)
Forms faxed to [place] at [phone]. Forms sent for scanning.

## 2016-09-25 NOTE — Telephone Encounter (Signed)
Received forms for compression stockings-chart reviewed, do not see any problems in chart to indicate need for compression socks. Can you please advise me or Pt?

## 2016-10-13 DIAGNOSIS — M79669 Pain in unspecified lower leg: Secondary | ICD-10-CM | POA: Diagnosis not present

## 2016-11-11 DIAGNOSIS — F419 Anxiety disorder, unspecified: Secondary | ICD-10-CM | POA: Diagnosis not present

## 2016-11-11 DIAGNOSIS — Z79899 Other long term (current) drug therapy: Secondary | ICD-10-CM | POA: Diagnosis not present

## 2016-11-11 DIAGNOSIS — F338 Other recurrent depressive disorders: Secondary | ICD-10-CM | POA: Diagnosis not present

## 2016-11-11 DIAGNOSIS — F1920 Other psychoactive substance dependence, uncomplicated: Secondary | ICD-10-CM | POA: Diagnosis not present

## 2016-11-11 DIAGNOSIS — F902 Attention-deficit hyperactivity disorder, combined type: Secondary | ICD-10-CM | POA: Diagnosis not present

## 2017-02-08 DIAGNOSIS — F902 Attention-deficit hyperactivity disorder, combined type: Secondary | ICD-10-CM | POA: Diagnosis not present

## 2017-02-08 DIAGNOSIS — F338 Other recurrent depressive disorders: Secondary | ICD-10-CM | POA: Diagnosis not present

## 2017-02-08 DIAGNOSIS — Z79899 Other long term (current) drug therapy: Secondary | ICD-10-CM | POA: Diagnosis not present

## 2017-02-08 DIAGNOSIS — F419 Anxiety disorder, unspecified: Secondary | ICD-10-CM | POA: Diagnosis not present

## 2017-02-19 DIAGNOSIS — S338XXA Sprain of other parts of lumbar spine and pelvis, initial encounter: Secondary | ICD-10-CM | POA: Diagnosis not present

## 2017-02-19 DIAGNOSIS — M9904 Segmental and somatic dysfunction of sacral region: Secondary | ICD-10-CM | POA: Diagnosis not present

## 2017-02-19 DIAGNOSIS — M9903 Segmental and somatic dysfunction of lumbar region: Secondary | ICD-10-CM | POA: Diagnosis not present

## 2017-02-19 DIAGNOSIS — M9905 Segmental and somatic dysfunction of pelvic region: Secondary | ICD-10-CM | POA: Diagnosis not present

## 2017-02-22 DIAGNOSIS — M9903 Segmental and somatic dysfunction of lumbar region: Secondary | ICD-10-CM | POA: Diagnosis not present

## 2017-02-22 DIAGNOSIS — M9905 Segmental and somatic dysfunction of pelvic region: Secondary | ICD-10-CM | POA: Diagnosis not present

## 2017-02-22 DIAGNOSIS — S338XXA Sprain of other parts of lumbar spine and pelvis, initial encounter: Secondary | ICD-10-CM | POA: Diagnosis not present

## 2017-02-22 DIAGNOSIS — M9904 Segmental and somatic dysfunction of sacral region: Secondary | ICD-10-CM | POA: Diagnosis not present

## 2017-02-26 DIAGNOSIS — M9904 Segmental and somatic dysfunction of sacral region: Secondary | ICD-10-CM | POA: Diagnosis not present

## 2017-02-26 DIAGNOSIS — S338XXA Sprain of other parts of lumbar spine and pelvis, initial encounter: Secondary | ICD-10-CM | POA: Diagnosis not present

## 2017-02-26 DIAGNOSIS — M9905 Segmental and somatic dysfunction of pelvic region: Secondary | ICD-10-CM | POA: Diagnosis not present

## 2017-02-26 DIAGNOSIS — M9903 Segmental and somatic dysfunction of lumbar region: Secondary | ICD-10-CM | POA: Diagnosis not present

## 2017-03-05 DIAGNOSIS — M9905 Segmental and somatic dysfunction of pelvic region: Secondary | ICD-10-CM | POA: Diagnosis not present

## 2017-03-05 DIAGNOSIS — S338XXA Sprain of other parts of lumbar spine and pelvis, initial encounter: Secondary | ICD-10-CM | POA: Diagnosis not present

## 2017-03-05 DIAGNOSIS — M9903 Segmental and somatic dysfunction of lumbar region: Secondary | ICD-10-CM | POA: Diagnosis not present

## 2017-03-05 DIAGNOSIS — M9904 Segmental and somatic dysfunction of sacral region: Secondary | ICD-10-CM | POA: Diagnosis not present

## 2017-03-22 DIAGNOSIS — M9903 Segmental and somatic dysfunction of lumbar region: Secondary | ICD-10-CM | POA: Diagnosis not present

## 2017-03-22 DIAGNOSIS — M9905 Segmental and somatic dysfunction of pelvic region: Secondary | ICD-10-CM | POA: Diagnosis not present

## 2017-03-22 DIAGNOSIS — M9904 Segmental and somatic dysfunction of sacral region: Secondary | ICD-10-CM | POA: Diagnosis not present

## 2017-03-22 DIAGNOSIS — S338XXA Sprain of other parts of lumbar spine and pelvis, initial encounter: Secondary | ICD-10-CM | POA: Diagnosis not present

## 2017-03-24 ENCOUNTER — Other Ambulatory Visit: Payer: Self-pay | Admitting: Internal Medicine

## 2017-03-24 DIAGNOSIS — N529 Male erectile dysfunction, unspecified: Secondary | ICD-10-CM

## 2017-03-29 DIAGNOSIS — M9905 Segmental and somatic dysfunction of pelvic region: Secondary | ICD-10-CM | POA: Diagnosis not present

## 2017-03-29 DIAGNOSIS — M7712 Lateral epicondylitis, left elbow: Secondary | ICD-10-CM | POA: Diagnosis not present

## 2017-03-29 DIAGNOSIS — M9903 Segmental and somatic dysfunction of lumbar region: Secondary | ICD-10-CM | POA: Diagnosis not present

## 2017-03-29 DIAGNOSIS — M7711 Lateral epicondylitis, right elbow: Secondary | ICD-10-CM | POA: Diagnosis not present

## 2017-04-05 DIAGNOSIS — M9905 Segmental and somatic dysfunction of pelvic region: Secondary | ICD-10-CM | POA: Diagnosis not present

## 2017-04-05 DIAGNOSIS — M7711 Lateral epicondylitis, right elbow: Secondary | ICD-10-CM | POA: Diagnosis not present

## 2017-04-05 DIAGNOSIS — M9903 Segmental and somatic dysfunction of lumbar region: Secondary | ICD-10-CM | POA: Diagnosis not present

## 2017-04-05 DIAGNOSIS — M7712 Lateral epicondylitis, left elbow: Secondary | ICD-10-CM | POA: Diagnosis not present

## 2017-04-19 DIAGNOSIS — M9903 Segmental and somatic dysfunction of lumbar region: Secondary | ICD-10-CM | POA: Diagnosis not present

## 2017-04-19 DIAGNOSIS — M9905 Segmental and somatic dysfunction of pelvic region: Secondary | ICD-10-CM | POA: Diagnosis not present

## 2017-04-19 DIAGNOSIS — M7711 Lateral epicondylitis, right elbow: Secondary | ICD-10-CM | POA: Diagnosis not present

## 2017-04-19 DIAGNOSIS — M7712 Lateral epicondylitis, left elbow: Secondary | ICD-10-CM | POA: Diagnosis not present

## 2017-05-03 ENCOUNTER — Ambulatory Visit (INDEPENDENT_AMBULATORY_CARE_PROVIDER_SITE_OTHER): Payer: BLUE CROSS/BLUE SHIELD | Admitting: Internal Medicine

## 2017-05-03 ENCOUNTER — Encounter: Payer: Self-pay | Admitting: Internal Medicine

## 2017-05-03 VITALS — BP 116/68 | HR 59 | Temp 97.6°F | Resp 14 | Ht 75.0 in | Wt 251.0 lb

## 2017-05-03 DIAGNOSIS — Z0000 Encounter for general adult medical examination without abnormal findings: Secondary | ICD-10-CM | POA: Diagnosis not present

## 2017-05-03 DIAGNOSIS — D126 Benign neoplasm of colon, unspecified: Secondary | ICD-10-CM

## 2017-05-03 DIAGNOSIS — R1011 Right upper quadrant pain: Secondary | ICD-10-CM

## 2017-05-03 LAB — CBC WITH DIFFERENTIAL/PLATELET
BASOS ABS: 0 10*3/uL (ref 0.0–0.1)
Basophils Relative: 0.7 % (ref 0.0–3.0)
EOS ABS: 0.1 10*3/uL (ref 0.0–0.7)
Eosinophils Relative: 2.1 % (ref 0.0–5.0)
HCT: 45.5 % (ref 39.0–52.0)
HEMOGLOBIN: 15.4 g/dL (ref 13.0–17.0)
LYMPHS ABS: 1.4 10*3/uL (ref 0.7–4.0)
Lymphocytes Relative: 21.6 % (ref 12.0–46.0)
MCHC: 33.8 g/dL (ref 30.0–36.0)
MCV: 89.7 fl (ref 78.0–100.0)
MONO ABS: 0.6 10*3/uL (ref 0.1–1.0)
Monocytes Relative: 8.9 % (ref 3.0–12.0)
NEUTROS PCT: 66.7 % (ref 43.0–77.0)
Neutro Abs: 4.2 10*3/uL (ref 1.4–7.7)
Platelets: 238 10*3/uL (ref 150.0–400.0)
RBC: 5.08 Mil/uL (ref 4.22–5.81)
RDW: 13.4 % (ref 11.5–15.5)
WBC: 6.3 10*3/uL (ref 4.0–10.5)

## 2017-05-03 LAB — COMPREHENSIVE METABOLIC PANEL
ALT: 36 U/L (ref 0–53)
AST: 21 U/L (ref 0–37)
Albumin: 4.3 g/dL (ref 3.5–5.2)
Alkaline Phosphatase: 58 U/L (ref 39–117)
BILIRUBIN TOTAL: 0.7 mg/dL (ref 0.2–1.2)
BUN: 15 mg/dL (ref 6–23)
CALCIUM: 9 mg/dL (ref 8.4–10.5)
CHLORIDE: 103 meq/L (ref 96–112)
CO2: 31 meq/L (ref 19–32)
Creatinine, Ser: 1.24 mg/dL (ref 0.40–1.50)
GFR: 65.59 mL/min (ref >60.00)
GLUCOSE: 92 mg/dL (ref 70–99)
Potassium: 4.9 mEq/L (ref 3.5–5.1)
Sodium: 138 mEq/L (ref 135–145)
Total Protein: 7 g/dL (ref 6.0–8.3)

## 2017-05-03 LAB — LIPID PANEL
CHOL/HDL RATIO: 5
Cholesterol: 189 mg/dL (ref 0–200)
HDL: 40.5 mg/dL (ref >39.00)
NONHDL: 148.51
TRIGLYCERIDES: 217 mg/dL — AB (ref 0.0–149.0)
VLDL: 43.4 mg/dL — ABNORMAL HIGH (ref 0.0–40.0)

## 2017-05-03 LAB — TSH: TSH: 1.85 u[IU]/mL (ref 0.35–4.50)

## 2017-05-03 LAB — LDL CHOLESTEROL, DIRECT: Direct LDL: 127 mg/dL

## 2017-05-03 NOTE — Progress Notes (Signed)
Pre visit review using our clinic review tool, if applicable. No additional management support is needed unless otherwise documented below in the visit note. 

## 2017-05-03 NOTE — Patient Instructions (Signed)
GO TO THE LAB : Get the blood work     GO TO THE FRONT DESK Schedule your next appointment for a physical exam in 1 year  Will schedule a ultrasound of your abdomen

## 2017-05-03 NOTE — Assessment & Plan Note (Addendum)
-  Td 2012;  Had a flu shot    -CCS: Never had a cscope (saw GI d/t diarrhea, rx a cscope, did not proceed, diarrhea now gone) -Prostate ca screening: had a  DRE-UA, urine culture and PSA, all wnl, 2017 (had LUTS) -Diet and exercise discussed. -Labs: CMP, FLP, CBC, TSH, UA

## 2017-05-03 NOTE — Progress Notes (Signed)
Subjective:    Patient ID: Larry Yu, male    DOB: [birth year], 51 y.o.   MRN: [id number]  DOS:  05/03/2017 Type of visit - description : cpx Interval history: In general feeling well, he does have a few concerns   Review of Systems  Had L UTS: Took Flomax temporarily, had side effects, self discontinued.  Now essentially asymptomatic Also, for at least one year is having on and off episodes of right-sided abdominal pain, he points to the flank, pain radiates down to the lower abdomen and GU area. Episodes last 3-4 hours. They were not associated with dysuria, gross hematuria.  He did have some nausea, vomited a couple of times.  He felt sweaty and somewhat clammy.  Some episodes were associated with food intake. Last episode was 3-4 months ago.   Other than above, a 14 point review of systems is negative     Past Medical History:  Diagnosis Date  . ADHD (attention deficit hyperactivity disorder)    see's Dr. Johnnye Sima  . Allergic rhinitis   . Depression 12/21/2012  . Hyperlipidemia   . OSA (obstructive sleep apnea) 05-2013    Past Surgical History:  Procedure Laterality Date  . NO PAST SURGERIES      Social History   Socioeconomic History  . Marital status: Divorced    Spouse name: Not on file  . Number of children: 2  . Years of education: Not on file  . Highest education level: Not on file  Social Needs  . Financial resource strain: Not on file  . Food insecurity - worry: Not on file  . Food insecurity - inability: Not on file  . Transportation needs - medical: Not on file  . Transportation needs - non-medical: Not on file  Occupational History  . Occupation: Training and development officer: [employer]  Tobacco Use  . Smoking status: Former Smoker    Packs/day: 1.00    Years: 13.00    Pack years: 13.00    Types: Cigarettes, Cigars    Last attempt to quit: 05/18/1993    Years since quitting: 23.9  . Smokeless tobacco: Never Used  . Tobacco comment: vaping     Substance and Sexual Activity  . Alcohol use: Yes    Alcohol/week: 0.0 oz    Comment: socially 1-2 drinks ( 2 drinks 3x week)  . Drug use: No    Comment: QUIT 1995  . Sexual activity: Not on file  Other Topics Concern  . Not on file  Social History Narrative   Divorced, remarried 2013, divorced again, lives by himself       Family History  Problem Relation Age of Onset  . Hypertension Mother   . Dementia Mother        dx age 86  . CAD Neg Hx   . Diabetes Neg Hx   . Colon cancer Neg Hx   . Prostate cancer Neg Hx   . Stroke Neg Hx      Allergies as of 05/03/2017   No Known Allergies     Medication List        Accurate as of 05/03/17 11:59 PM. Always use your most recent med list.          ADZENYS XR-ODT 18.8 MG Tbed Generic drug:  Amphetamine ER Take 1 tablet by mouth daily.   buPROPion 100 MG tablet Commonly known as:  WELLBUTRIN Take 1 tablet (100 mg total) by mouth daily.   escitalopram 20 MG  tablet Commonly known as:  LEXAPRO Take 0.5 tablets (10 mg total) by mouth daily.   tadalafil 20 MG tablet Commonly known as:  CIALIS TAKE ONE-HALF (1/2) TO ONE TABLET EVERY OTHER DAY AS NEEDED FOR ERECTILE DYSFUNCTION   tamsulosin 0.4 MG Caps capsule Commonly known as:  FLOMAX Take 1 capsule (0.4 mg total) by mouth at bedtime.   valACYclovir 500 MG tablet Commonly known as:  VALTREX Take 1 tablet (500 mg total) by mouth 2 (two) times daily.   zaleplon 10 MG capsule Commonly known as:  SONATA TAKE ONE CAPSULE BY MOUTH FOR SLEEP IF NEEDED          Objective:   Physical Exam BP 116/68 (BP Location: Left Arm, Patient Position: Sitting, Cuff Size: Normal)   Pulse (!) 59   Temp 97.6 F (36.4 C) (Oral)   Resp 14   Ht 6\' 3"  (1.905 m)   Wt 251 lb (113.9 kg)   SpO2 98%   BMI 31.37 kg/m      General:   Well developed, well nourished . NAD.  Neck: No  thyromegaly  HEENT:  Normocephalic . Face symmetric, atraumatic Lungs:  CTA B Normal respiratory  effort, no intercostal retractions, no accessory muscle use. Heart: RRR,  no murmur.  No pretibial edema bilaterally  Abdomen:  Not distended, soft, non-tender. No rebound or rigidity.   Skin: Exposed areas without rash. Not pale. Not jaundice Neurologic:  alert & oriented X3.  Speech normal, gait appropriate for age and unassisted Strength symmetric and appropriate for age.  Psych: Cognition and judgment appear intact.  Cooperative with normal attention span and concentration.  Behavior appropriate. No anxious or depressed appearing.    Assessment & Plan:   Assessment  Hyperlipidemia Depression-anxiety   ADD - formally dx by Dr Johnnye Sima  and started meds ~ 05-2015, f/u by [facility] Attention Specialist  OSA on CPAP , Dr Annamaria Boots , rarely takes sonata HSV  Allergic rhinitis  PLAN: L UTS: See last office visit, workup negative, tried Flomax temporarily, had side effects, self DC.  Currently asx Right-sided abdominal pain: On and off episodes for at least one year, no recent episodes.  DDX includes a kidney stone, less likely GB stone.  We will get UA and ultrasound.  Will call if symptoms resurface. Chronic diarrhea: Resolved Depression anxiety ADD: Well-controlled RTC 1 year, RF as needed

## 2017-05-04 DIAGNOSIS — M9905 Segmental and somatic dysfunction of pelvic region: Secondary | ICD-10-CM | POA: Diagnosis not present

## 2017-05-04 DIAGNOSIS — M9903 Segmental and somatic dysfunction of lumbar region: Secondary | ICD-10-CM | POA: Diagnosis not present

## 2017-05-04 DIAGNOSIS — M7711 Lateral epicondylitis, right elbow: Secondary | ICD-10-CM | POA: Diagnosis not present

## 2017-05-04 DIAGNOSIS — M7712 Lateral epicondylitis, left elbow: Secondary | ICD-10-CM | POA: Diagnosis not present

## 2017-05-04 LAB — URINALYSIS, ROUTINE W REFLEX MICROSCOPIC
BILIRUBIN URINE: NEGATIVE
HGB URINE DIPSTICK: NEGATIVE
KETONES UR: NEGATIVE
LEUKOCYTES UA: NEGATIVE
NITRITE: NEGATIVE
PH: 5 (ref 5.0–8.0)
RBC / HPF: NONE SEEN (ref 0–?)
Specific Gravity, Urine: 1.025 (ref 1.000–1.030)
Total Protein, Urine: NEGATIVE
UROBILINOGEN UA: 0.2 (ref 0.0–1.0)
Urine Glucose: NEGATIVE

## 2017-05-04 NOTE — Assessment & Plan Note (Signed)
L UTS: See last office visit, workup negative, tried Flomax temporarily, had side effects, self DC.  Currently asx Right-sided abdominal pain: On and off episodes for at least one year, no recent episodes.  DDX includes a kidney stone, less likely GB stone.  We will get UA and ultrasound.  Will call if symptoms resurface. Chronic diarrhea: Resolved Depression anxiety ADD: Well-controlled RTC 1 year, RF as needed

## 2017-05-06 ENCOUNTER — Encounter (HOSPITAL_BASED_OUTPATIENT_CLINIC_OR_DEPARTMENT_OTHER): Payer: Self-pay

## 2017-05-06 ENCOUNTER — Ambulatory Visit (HOSPITAL_BASED_OUTPATIENT_CLINIC_OR_DEPARTMENT_OTHER)
Admission: RE | Admit: 2017-05-06 | Discharge: 2017-05-06 | Disposition: A | Discharge status: Home / Self Care | Payer: BLUE CROSS/BLUE SHIELD | Source: Ambulatory Visit | Attending: Internal Medicine | Admitting: Internal Medicine

## 2017-05-06 DIAGNOSIS — K760 Fatty (change of) liver, not elsewhere classified: Secondary | ICD-10-CM | POA: Insufficient documentation

## 2017-05-06 DIAGNOSIS — N200 Calculus of kidney: Secondary | ICD-10-CM | POA: Diagnosis not present

## 2017-05-06 DIAGNOSIS — R1011 Right upper quadrant pain: Secondary | ICD-10-CM | POA: Insufficient documentation

## 2017-05-10 NOTE — Addendum Note (Signed)
Addended byDamita Dunnings D on: 05/10/2017 09:23 AM   Modules accepted: Orders

## 2017-06-09 ENCOUNTER — Other Ambulatory Visit: Payer: Self-pay | Admitting: Internal Medicine

## 2017-06-17 DIAGNOSIS — Z79899 Other long term (current) drug therapy: Secondary | ICD-10-CM | POA: Diagnosis not present

## 2017-06-17 DIAGNOSIS — F338 Other recurrent depressive disorders: Secondary | ICD-10-CM | POA: Diagnosis not present

## 2017-06-17 DIAGNOSIS — F419 Anxiety disorder, unspecified: Secondary | ICD-10-CM | POA: Diagnosis not present

## 2017-06-17 DIAGNOSIS — F902 Attention-deficit hyperactivity disorder, combined type: Secondary | ICD-10-CM | POA: Diagnosis not present

## 2017-08-05 ENCOUNTER — Other Ambulatory Visit: Payer: Self-pay | Admitting: Internal Medicine

## 2017-08-05 MED ORDER — BUPROPION HCL 100 MG PO TABS: 100.0000 mg | ORAL_TABLET | Freq: Every day | ORAL | 2 refills | Status: DC

## 2017-08-05 MED ORDER — ESCITALOPRAM OXALATE 20 MG PO TABS: 10.0000 mg | ORAL_TABLET | Freq: Every day | ORAL | 2 refills | Status: DC

## 2017-08-05 NOTE — Telephone Encounter (Signed)
Rxs sent

## 2017-08-05 NOTE — Telephone Encounter (Signed)
Copied from [place]. Topic: Quick Communication - See Telephone Encounter >> Aug 05, 2017 10:05 AM Ivar Drape wrote: CRM for notification. See Telephone encounter for: 08/05/17. Patient would like refills on the following medications and sent to his preferred [facility] [address]. [place]:  1) buPROPion (WELLBUTRIN) 100 MG tablet  2) escitalopram (LEXAPRO) 20 MG tablet

## 2017-08-11 DIAGNOSIS — R1084 Generalized abdominal pain: Secondary | ICD-10-CM | POA: Diagnosis not present

## 2017-08-12 ENCOUNTER — Other Ambulatory Visit (HOSPITAL_COMMUNITY): Payer: Self-pay | Admitting: General Surgery

## 2017-08-12 DIAGNOSIS — R1084 Generalized abdominal pain: Secondary | ICD-10-CM

## 2017-09-06 DIAGNOSIS — F902 Attention-deficit hyperactivity disorder, combined type: Secondary | ICD-10-CM | POA: Diagnosis not present

## 2017-09-06 DIAGNOSIS — Z79899 Other long term (current) drug therapy: Secondary | ICD-10-CM | POA: Diagnosis not present

## 2017-09-06 DIAGNOSIS — F900 Attention-deficit hyperactivity disorder, predominantly inattentive type: Secondary | ICD-10-CM | POA: Diagnosis not present

## 2017-10-18 ENCOUNTER — Telehealth: Payer: Self-pay | Admitting: *Deleted

## 2017-10-18 NOTE — Telephone Encounter (Signed)
Received Physician Orders from [facility]; forwarded to provider/SLS 06/03

## 2017-10-26 NOTE — Telephone Encounter (Signed)
Form faxed to [facility] at [phone]. Form sent for scanning.

## 2017-10-26 NOTE — Telephone Encounter (Signed)
Last year, patient request similar paperwork, apparently his company sponsor this program.  Will sign

## 2017-12-06 DIAGNOSIS — F902 Attention-deficit hyperactivity disorder, combined type: Secondary | ICD-10-CM | POA: Diagnosis not present

## 2017-12-06 DIAGNOSIS — Z79899 Other long term (current) drug therapy: Secondary | ICD-10-CM | POA: Diagnosis not present

## 2018-02-08 ENCOUNTER — Other Ambulatory Visit: Payer: Self-pay | Admitting: Internal Medicine

## 2018-02-09 NOTE — Telephone Encounter (Signed)
Ok to refill as we did last time.

## 2018-02-09 NOTE — Telephone Encounter (Signed)
Please advise on sonata refill  Last seen here 02/21/16  Pending appt 03/31/18 Last given # 30 with 2 rf 09/18/16

## 2018-03-14 DIAGNOSIS — F902 Attention-deficit hyperactivity disorder, combined type: Secondary | ICD-10-CM | POA: Diagnosis not present

## 2018-03-14 DIAGNOSIS — F419 Anxiety disorder, unspecified: Secondary | ICD-10-CM | POA: Diagnosis not present

## 2018-03-14 DIAGNOSIS — Z79899 Other long term (current) drug therapy: Secondary | ICD-10-CM | POA: Diagnosis not present

## 2018-03-31 ENCOUNTER — Ambulatory Visit (INDEPENDENT_AMBULATORY_CARE_PROVIDER_SITE_OTHER): Payer: BLUE CROSS/BLUE SHIELD | Admitting: Internal Medicine

## 2018-03-31 ENCOUNTER — Encounter: Payer: Self-pay | Admitting: Internal Medicine

## 2018-03-31 DIAGNOSIS — G4733 Obstructive sleep apnea (adult) (pediatric): Secondary | ICD-10-CM

## 2018-03-31 DIAGNOSIS — F5101 Primary insomnia: Secondary | ICD-10-CM | POA: Diagnosis not present

## 2018-03-31 NOTE — Patient Instructions (Signed)
We will ask [facility] for a current CPAP download to check how your machine is functioning.  You could ask [facility] when you might be ellgible to replace your old CPAP machine, in case it might be an issue for discussion next year.  Please call us if we can help

## 2018-03-31 NOTE — Progress Notes (Signed)
HPI male smoker followed for OSA, complicated by ADHD/Amphetamine NPSG 04/06/14- AHI 87/ hr,  --------------------------------------------------------------------------------  02/21/2016-52 year old male former smoker followed for OSA CPAP auto 5-20/[facility]> this visit changed to 4-15 FOLLOWS FOR:DME: [facility]. Pt states he is trying to wear CPAP most everynight; pt has not been sleeping much lately with or without the CPAP. DL attached-takes CPAP off through the night due to pressure too high. Work stress with difficulty initiating sleep some nights. Amphetamine for ADHD has also helped with 20 pound weight loss. He only takes it in the morning to avoid sleep disturbance. Download shows inadequate compliance 40%/4 hours with good control AHI 0.8/hour. He would like to start with lower pressure and not go as high.  03/31/2018- 52 year old male former smoker followed for OSA, insomnia, complicated by ADHD/Amphetamine CPAP auto 4-15/[facility] -----pt wearing cpap avg 6hr nightly- feels pressure & mask are okay. waking feeling well rested Sonata 10 mg, amphetamine ER 18.8 mg, Lexapro 20 mg, Wellbutrin 100 mg Body weight today 254 pounds Uses Sonata 10 mg most nights-works well. No download available at this visit. Occasionally wakes with bitemporal headache.  ROS-see HPI   + = positive Constitutional:    weight loss, night sweats, fevers, chills, fatigue, lassitude. HEENT:    headaches, difficulty swallowing, tooth/dental problems, sore throat,       sneezing, itching, ear ache, nasal congestion, post nasal drip, snoring CV:    chest pain, orthopnea, PND, swelling in lower extremities, anasarca,                                                            dizziness, palpitations Resp:   shortness of breath with exertion or at rest.                productive cough,   non-productive cough, coughing up of blood.              change in color of mucus.  wheezing.   Skin:    rash or lesions. GI:  No-    heartburn, indigestion, abdominal pain, nausea, vomiting,  GU:  MS:   joint pain, stiffness,  Neuro-     nothing unusual Psych:  change in mood or affect.  depression or anxiety.   memory loss.  OBJ- Physical Exam General- Alert, Oriented, Affect-appropriate, Diistress- none acute, big man Skin- rash-none, lesions- none, excoriation- none Lymphadenopathy- none Head- atraumatic            Eyes- Gross vision intact, PERRLA, conjunctivae and secretions clear            Ears- Hearing, canals-normal            Nose- Clear, no-Septal dev, mucus, polyps, erosion, perforation             Throat- Mallampati III , mucosa clear , drainage- none, tonsils- atrophic Neck- flexible , trachea midline, no stridor , thyroid nl, carotid no bruit Chest - symmetrical excursion , unlabored           Heart/CV- RRR , no murmur , no gallop  , no rub, nl s1 s2                           - JVD- none , edema- none, stasis changes- none, varices-  none           Lung- clear to P&A, wheeze- none, cough- none , dullness-none, rub- none           Chest wall-  Abd-  Br/ Gen/ Rectal- Not done, not indicated Extrem- cyanosis- none, clubbing, none, atrophy- none, strength- nl Neuro- grossly intact to observation

## 2018-05-04 ENCOUNTER — Ambulatory Visit (INDEPENDENT_AMBULATORY_CARE_PROVIDER_SITE_OTHER): Payer: BLUE CROSS/BLUE SHIELD | Admitting: Internal Medicine

## 2018-05-04 ENCOUNTER — Encounter: Payer: Self-pay | Admitting: Internal Medicine

## 2018-05-04 VITALS — BP 124/78 | HR 56 | Temp 97.8°F | Resp 16 | Ht 75.0 in | Wt 245.4 lb

## 2018-05-04 DIAGNOSIS — Z0000 Encounter for general adult medical examination without abnormal findings: Secondary | ICD-10-CM

## 2018-05-04 DIAGNOSIS — Z1211 Encounter for screening for malignant neoplasm of colon: Secondary | ICD-10-CM

## 2018-05-04 DIAGNOSIS — G4733 Obstructive sleep apnea (adult) (pediatric): Secondary | ICD-10-CM | POA: Diagnosis not present

## 2018-05-04 LAB — COMPREHENSIVE METABOLIC PANEL
ALT: 42 U/L (ref 0–53)
AST: 20 U/L (ref 0–37)
Albumin: 4.5 g/dL (ref 3.5–5.2)
Alkaline Phosphatase: 67 U/L (ref 39–117)
BUN: 16 mg/dL (ref 6–23)
CHLORIDE: 104 meq/L (ref 96–112)
CO2: 33 meq/L — AB (ref 19–32)
CREATININE: 1.31 mg/dL (ref 0.40–1.50)
Calcium: 9.9 mg/dL (ref 8.4–10.5)
GFR: 61.32 mL/min (ref >60.00)
GLUCOSE: 103 mg/dL — AB (ref 70–99)
POTASSIUM: 5.2 meq/L — AB (ref 3.5–5.1)
SODIUM: 144 meq/L (ref 135–145)
Total Bilirubin: 0.7 mg/dL (ref 0.2–1.2)
Total Protein: 7 g/dL (ref 6.0–8.3)

## 2018-05-04 LAB — CBC WITH DIFFERENTIAL/PLATELET
Basophils Absolute: 0.1 10*3/uL (ref 0.0–0.1)
Basophils Relative: 1 % (ref 0.0–3.0)
Eosinophils Absolute: 0.2 10*3/uL (ref 0.0–0.7)
Eosinophils Relative: 2.4 % (ref 0.0–5.0)
HCT: 46.7 % (ref 39.0–52.0)
Hemoglobin: 15.7 g/dL (ref 13.0–17.0)
LYMPHS ABS: 1.9 10*3/uL (ref 0.7–4.0)
Lymphocytes Relative: 26.6 % (ref 12.0–46.0)
MCHC: 33.6 g/dL (ref 30.0–36.0)
MCV: 88.3 fl (ref 78.0–100.0)
Monocytes Absolute: 0.7 10*3/uL (ref 0.1–1.0)
Monocytes Relative: 9.6 % (ref 3.0–12.0)
NEUTROS ABS: 4.2 10*3/uL (ref 1.4–7.7)
NEUTROS PCT: 60.4 % (ref 43.0–77.0)
Platelets: 284 10*3/uL (ref 150.0–400.0)
RBC: 5.28 Mil/uL (ref 4.22–5.81)
RDW: 13.1 % (ref 11.5–15.5)
WBC: 7 10*3/uL (ref 4.0–10.5)

## 2018-05-04 LAB — PSA: PSA: 0.34 ng/mL (ref 0.10–4.00)

## 2018-05-04 LAB — LDL CHOLESTEROL, DIRECT: Direct LDL: 112 mg/dL

## 2018-05-04 LAB — LIPID PANEL
CHOLESTEROL: 193 mg/dL (ref 0–200)
HDL: 32.6 mg/dL — ABNORMAL LOW (ref >39.00)
NonHDL: 160.55
TRIGLYCERIDES: 236 mg/dL — AB (ref 0.0–149.0)
Total CHOL/HDL Ratio: 6
VLDL: 47.2 mg/dL — ABNORMAL HIGH (ref 0.0–40.0)

## 2018-05-04 NOTE — Assessment & Plan Note (Addendum)
-  Td 2012;  Had a flu shot    -CCS: Never had a cscope but he desires to proceed.  GI referral sent -Prostate ca screening: DRE, normal.  Check a PSA -Diet and exercise discussed. -Labs: CMP, FLP, CBC, PSA - Vaping: Advised patient about the issue, he is considering stop at some point .

## 2018-05-04 NOTE — Assessment & Plan Note (Signed)
Hyperlipidemia: Diet controlled, checking labs Depression, anxiety: PHQ 9 (4)  On Lexapro, Wellbutrin.  Symptoms could be better, declined increase medication, advised patient about the benefits of counseling.  Will think about it. H/o LUTS: Not taking any medications.  No symptoms. RTC 1 year.

## 2018-05-04 NOTE — Progress Notes (Signed)
Subjective:    Patient ID: Larry Yu, male    DOB: 06-18-1965, 52 y.o.   MRN: [id number]  DOS:  05/04/2018 Type of visit - description : CPX In general feeling well. He filled the [employer] 9 form, there is some room for improvement on his depression control, see below. No LUTS at this point   Review of Systems  Other than above, a 14 point review of systems is negative    Past Medical History:  Diagnosis Date  . ADHD (attention deficit hyperactivity disorder)    see's Dr. Johnnye Sima  . Allergic rhinitis   . Depression 12/21/2012  . Hyperlipidemia   . OSA (obstructive sleep apnea) 05-2013    Past Surgical History:  Procedure Laterality Date  . NO PAST SURGERIES      Social History   Socioeconomic History  . Marital status: Divorced    Spouse name: Not on file  . Number of children: 2  . Years of education: Not on file  . Highest education level: Not on file  Occupational History  . Occupation: Training and development officer: [employer]  Social Needs  . Financial resource strain: Not on file  . Food insecurity:    Worry: Not on file    Inability: Not on file  . Transportation needs:    Medical: Not on file    Non-medical: Not on file  Tobacco Use  . Smoking status: Former Smoker    Packs/day: 1.00    Years: 13.00    Pack years: 13.00    Types: Cigarettes, Cigars    Last attempt to quit: 05/18/1993    Years since quitting: 24.9  . Smokeless tobacco: Never Used  . Tobacco comment: vaping   Substance and Sexual Activity  . Alcohol use: Yes    Alcohol/week: 0.0 standard drinks    Comment: socially 1-2 drinks ( 2 drinks 3x week)  . Drug use: No    Comment: QUIT 1995  . Sexual activity: Not on file  Lifestyle  . Physical activity:    Days per week: Not on file    Minutes per session: Not on file  . Stress: Not on file  Relationships  . Social connections:    Talks on phone: Not on file    Gets together: Not on file    Attends religious service: Not on file    Active  member of club or organization: Not on file    Attends meetings of clubs or organizations: Not on file    Relationship status: Not on file  . Intimate partner violence:    Fear of current or ex partner: Not on file    Emotionally abused: Not on file    Physically abused: Not on file    Forced sexual activity: Not on file  Other Topics Concern  . Not on file  Social History Narrative   Divorced, remarried 2013, divorced again, lives by himself      Family History  Problem Relation Age of Onset  . Hypertension Mother   . Dementia Mother        dx age 39  . CAD Neg Hx   . Diabetes Neg Hx   . Colon cancer Neg Hx   . Prostate cancer Neg Hx   . Stroke Neg Hx      Allergies as of 05/04/2018   No Known Allergies     Medication List       Accurate as of May 04, 2018  5:50 PM. Always use your most recent med list.        ADZENYS XR-ODT 18.8 MG Tbed Generic drug:  Amphetamine ER Take 1 tablet by mouth daily.   buPROPion 100 MG tablet Commonly known as:  WELLBUTRIN Take 1 tablet (100 mg total) by mouth daily.   escitalopram 20 MG tablet Commonly known as:  LEXAPRO Take 0.5 tablets (10 mg total) by mouth daily.   tadalafil 20 MG tablet Commonly known as:  ADCIRCA/CIALIS TAKE ONE-HALF (1/2) TO ONE TABLET EVERY OTHER DAY AS NEEDED FOR ERECTILE DYSFUNCTION   valACYclovir 500 MG tablet Commonly known as:  VALTREX Take 1 tablet (500 mg total) by mouth 2 (two) times daily.   zaleplon 10 MG capsule Commonly known as:  SONATA TAKE ONE CAPSULE BY MOUTH FOR SLEEP IF NEEDED           Objective:   Physical Exam BP 124/78 (BP Location: Left Arm, Patient Position: Sitting, Cuff Size: Normal)   Pulse (!) 56   Temp 97.8 F (36.6 C) (Oral)   Resp 16   Ht 6\' 3"  (1.905 m)   Wt 245 lb 6 oz (111.3 kg)   SpO2 98%   BMI 30.67 kg/m  General: Well developed, NAD, BMI noted Neck: No  thyromegaly  HEENT:  Normocephalic . Face symmetric, atraumatic Lungs:  CTA  B Normal respiratory effort, no intercostal retractions, no accessory muscle use. Heart: RRR,  no murmur.  No pretibial edema bilaterally  Abdomen:  Not distended, soft, non-tender. No rebound or rigidity.   Rectal: External abnormalities: none. Normal sphincter tone. No rectal masses or tenderness.  Brown stools Prostate: Prostate gland firm and smooth, no enlargement, nodularity, tenderness, mass, asymmetry or induration Skin: Exposed areas without rash. Not pale. Not jaundice Neurologic:  alert & oriented X3.  Speech normal, gait appropriate for age and unassisted Strength symmetric and appropriate for age.  Psych: Cognition and judgment appear intact.  Cooperative with normal attention span and concentration.  Behavior appropriate. No anxious or depressed appearing.     Assessment & Plan:    Assessment  Hyperlipidemia Depression-anxiety   ADD - formally dx by Dr Johnnye Sima  and started meds ~ 05-2015, f/u by [facility] Attention Specialist  OSA on CPAP , Dr Annamaria Boots , rarely takes sonata HSV  Allergic rhinitis  PLAN: Hyperlipidemia: Diet controlled, checking labs Depression, anxiety: PHQ 9 (4)  On Lexapro, Wellbutrin.  Symptoms could be better, declined increase medication, advised patient about the benefits of counseling.  Will think about it. H/o LUTS: Not taking any medications.  No symptoms. RTC 1 year.

## 2018-05-04 NOTE — Progress Notes (Signed)
Pre visit review using our clinic review tool, if applicable. No additional management support is needed unless otherwise documented below in the visit note. 

## 2018-05-04 NOTE — Patient Instructions (Signed)
GO TO THE LAB : Get the blood work     GO TO THE FRONT DESK Schedule your next appointment   for a physical exam in 1 year 

## 2018-06-08 NOTE — Assessment & Plan Note (Signed)
Discussed sleep hygiene.  Read Drivers is helping him sleep, but he needs amphetamine in the daytime for ADHD.  We need to keep medication effects separate.  Discussed timing.

## 2018-06-08 NOTE — Assessment & Plan Note (Signed)
He remains on amphetamine for ADHD but indicates he is sleeping okay with CPAP. Plan-continue CPAP auto 4-15.  Request download from [facility].

## 2018-06-27 ENCOUNTER — Encounter: Payer: Self-pay | Admitting: Internal Medicine

## 2018-08-22 ENCOUNTER — Other Ambulatory Visit: Payer: Self-pay | Admitting: Internal Medicine

## 2018-08-30 ENCOUNTER — Telehealth: Payer: Self-pay | Admitting: Internal Medicine

## 2018-08-30 MED ORDER — ZALEPLON 10 MG PO CAPS: ORAL_CAPSULE | ORAL | 5 refills | Status: DC

## 2018-08-30 NOTE — Telephone Encounter (Signed)
Patient notified by phone that refill request for Sonata has been sent in per Dr. Annamaria Boots.  Nothing further needed.

## 2018-08-30 NOTE — Telephone Encounter (Signed)
Returned phone call to patient, requesting refill of Sonata.   CY please advise. Order has been pended.

## 2018-08-30 NOTE — Telephone Encounter (Signed)
Sonata refill e-sent 

## 2018-11-01 DIAGNOSIS — F900 Attention-deficit hyperactivity disorder, predominantly inattentive type: Secondary | ICD-10-CM | POA: Diagnosis not present

## 2018-11-01 DIAGNOSIS — F419 Anxiety disorder, unspecified: Secondary | ICD-10-CM | POA: Diagnosis not present

## 2018-11-01 DIAGNOSIS — Z79899 Other long term (current) drug therapy: Secondary | ICD-10-CM | POA: Diagnosis not present

## 2018-11-01 DIAGNOSIS — F338 Other recurrent depressive disorders: Secondary | ICD-10-CM | POA: Diagnosis not present

## 2019-03-03 IMAGING — US US ABDOMEN COMPLETE
1 series · 14 of 25 positions shown · non-contrast
Comparison: None.

CLINICAL DATA: Right upper quadrant pain

EXAM:
ABDOMEN ULTRASOUND COMPLETE

[Series 1: us abdomen complete · 0.32mm/px · 14 of 117 slices shown]
[im 1/117]
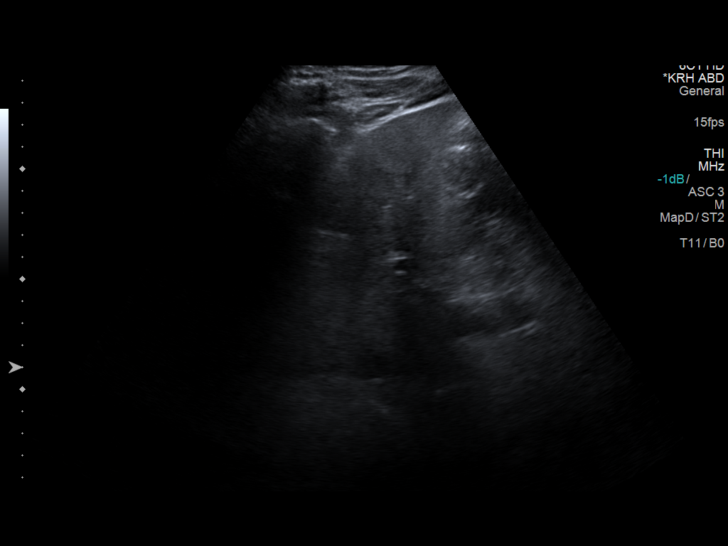
[im 10/117]
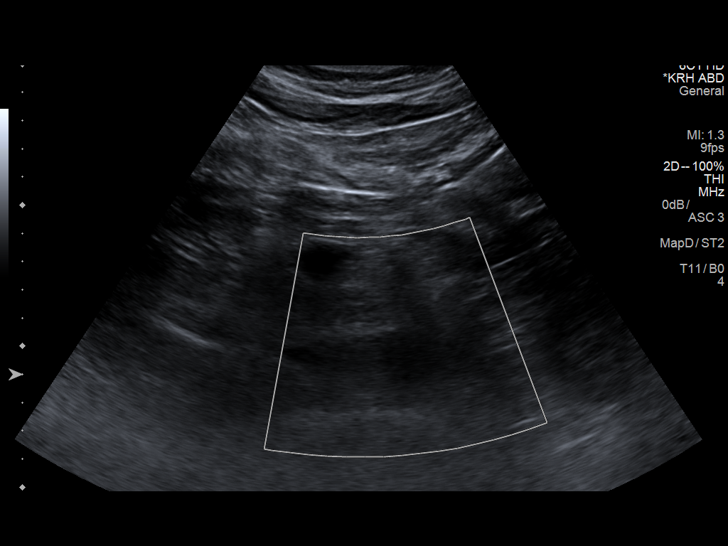
[im 20/117]
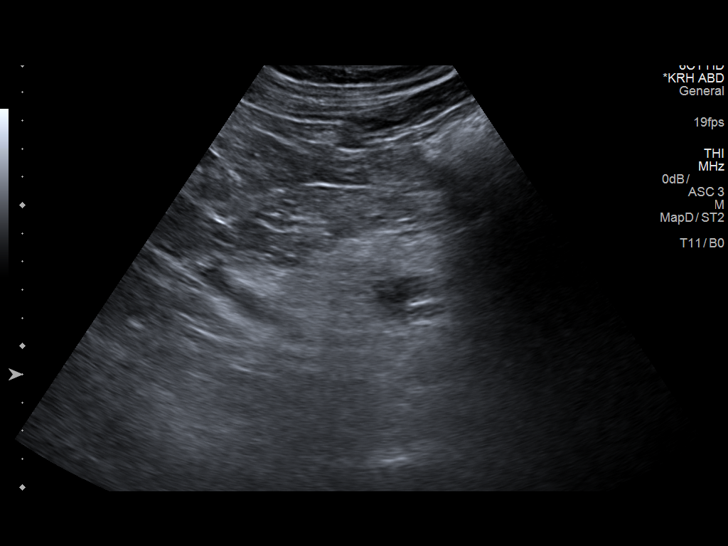
[im 30/117]
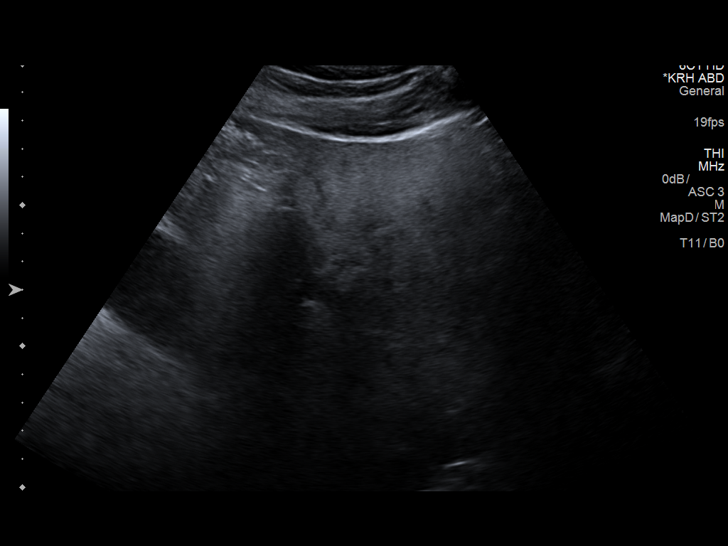
[im 39/117]
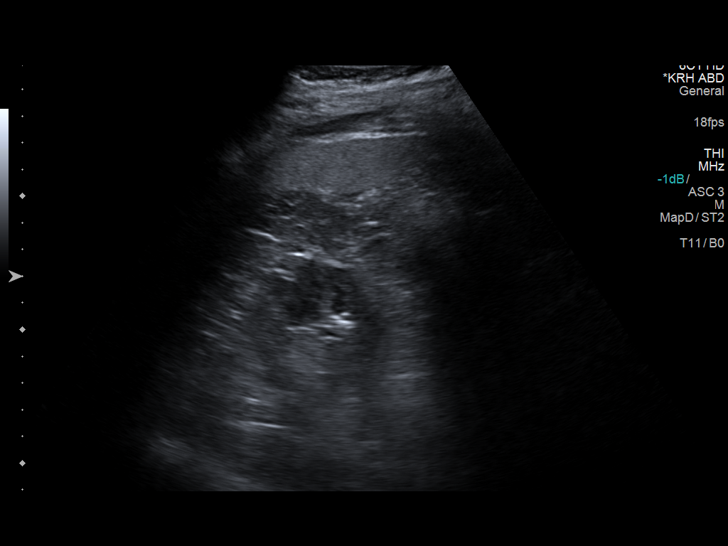
[im 44/117]
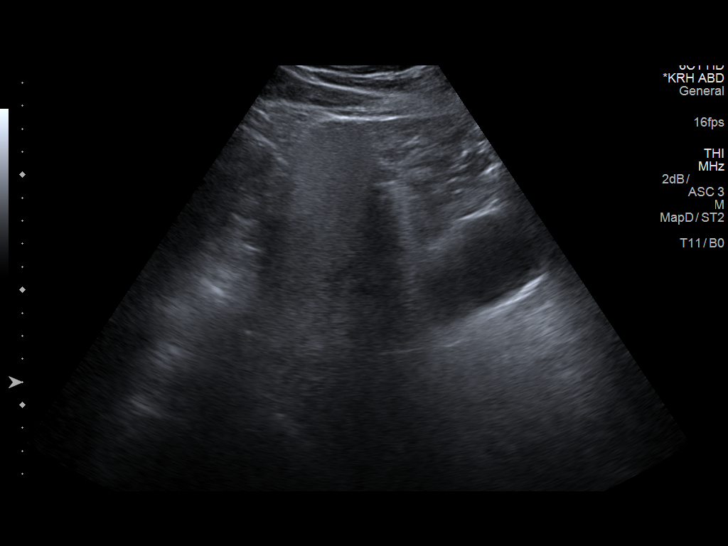
[im 54/117]
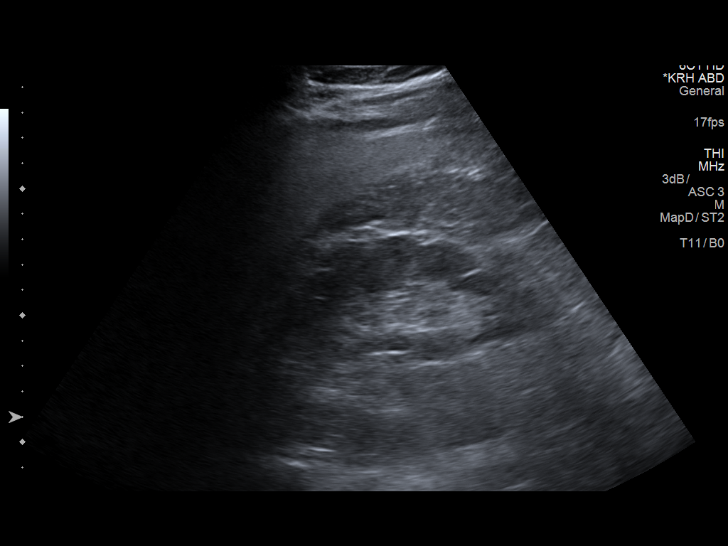
[im 63/117]
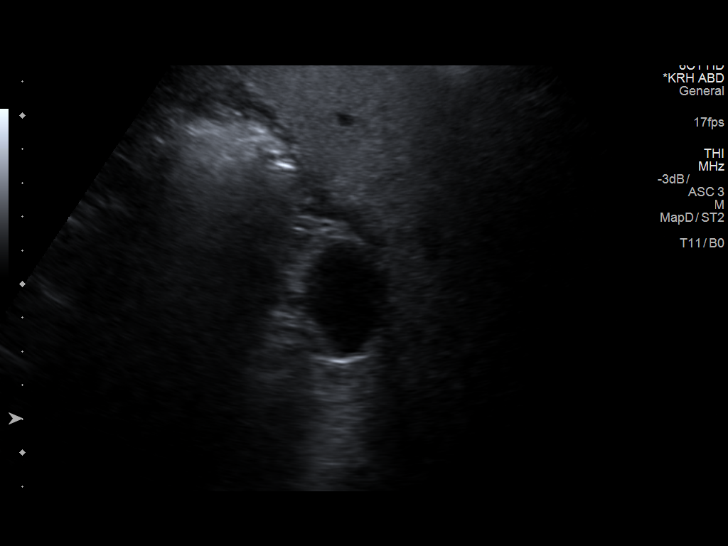
[im 73/117]
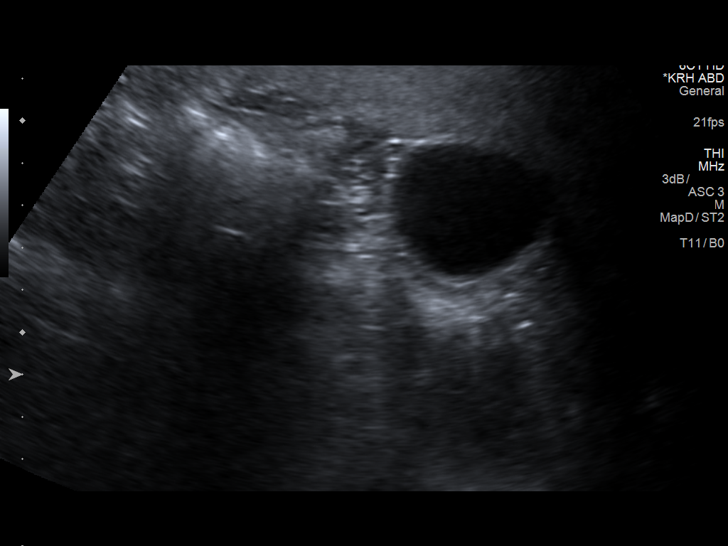
[im 78/117]
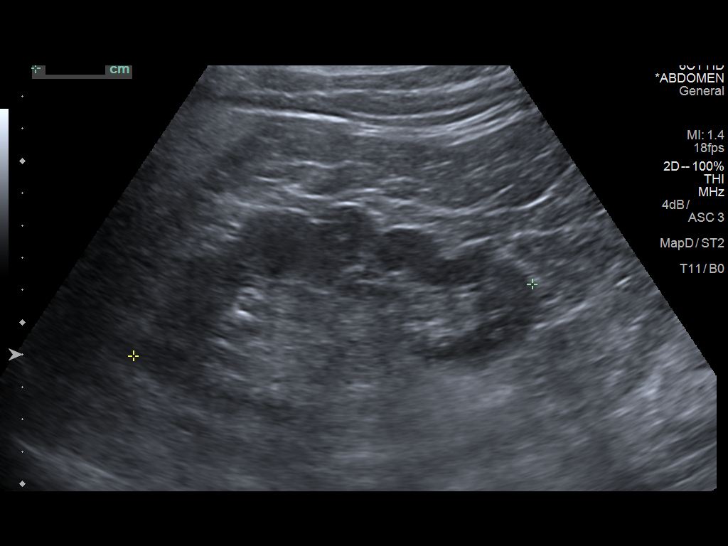
[im 88/117]
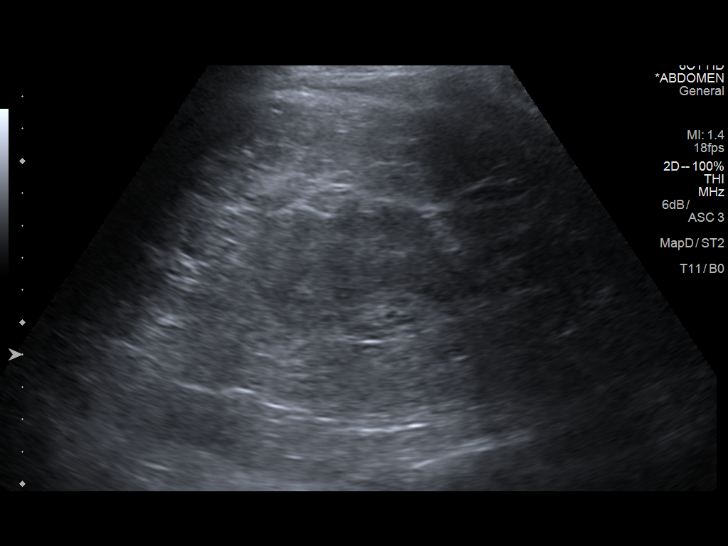
[im 97/117]
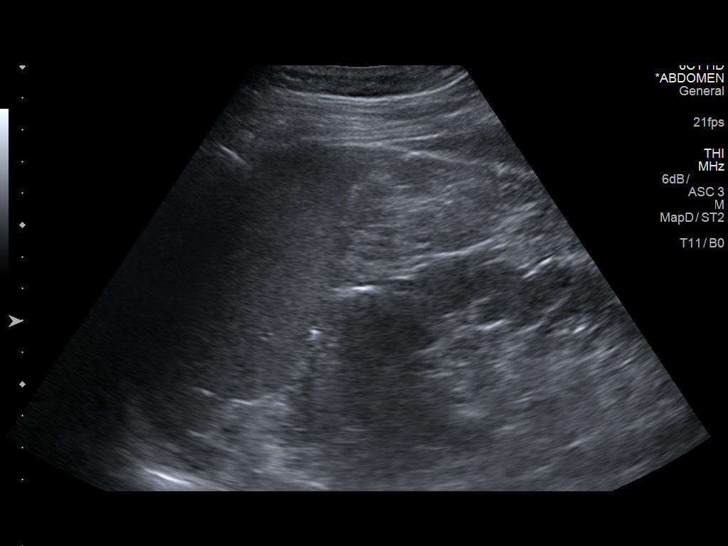
[im 107/117]
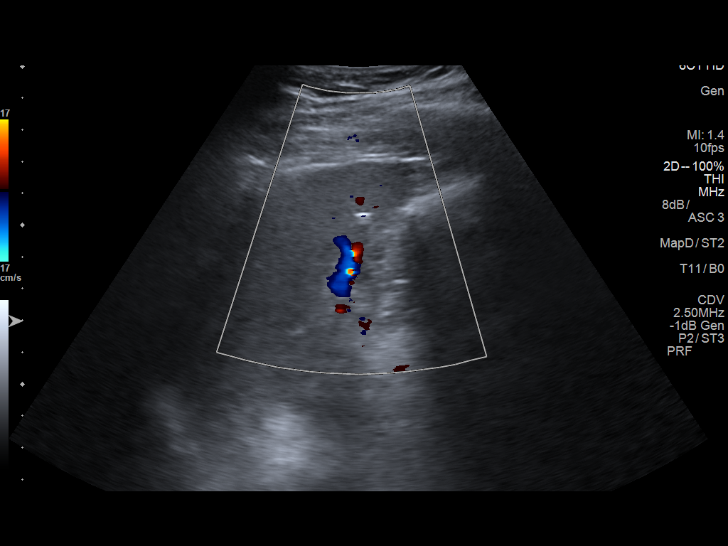
[im 117/117]
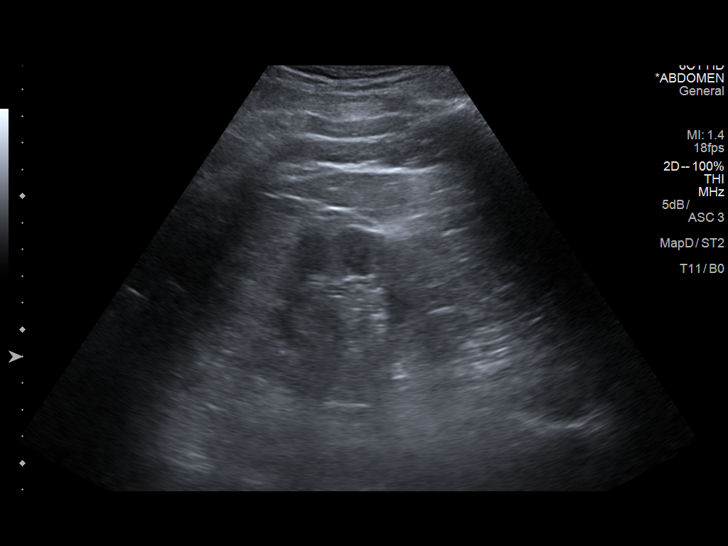

[14 of 25 positions shown; findings below may reference images not displayed]

FINDINGS: Gallbladder: Ring down artifact from the anterior gallbladder wall
compatible with adenomyomatosis. No stones or wall thickening.
Negative sonographic [place].

Common bile duct: Diameter: 4 mm

Liver: Increased echotexture compatible with fatty infiltration. No
focal abnormality or biliary ductal dilatation. Portal vein is
patent on color Doppler imaging with normal direction of blood flow
towards the liver.

IVC: No abnormality visualized.

Pancreas: Not well visualized due to overlying bowel gas.

Spleen: Size and appearance within normal limits.

Right Kidney: Length: 12.5 cm. 8 mm echogenic shadowing focus in the
midpole compatible with nonobstructing stone. No hydronephrosis.
Normal echotexture..

Left Kidney: Length: 11.4 cm. Echogenicity within normal limits. No
mass or hydronephrosis visualized.

Abdominal aorta: No aneurysm visualized.

Other findings: None.
IMPRESSION: Fatty infiltration of the liver.

Adenomyomatosis of the gallbladder.  No visible stones.

Right nonobstructing nephrolithiasis.

## 2019-03-24 DIAGNOSIS — F900 Attention-deficit hyperactivity disorder, predominantly inattentive type: Secondary | ICD-10-CM | POA: Diagnosis not present

## 2019-03-24 DIAGNOSIS — F419 Anxiety disorder, unspecified: Secondary | ICD-10-CM | POA: Diagnosis not present

## 2019-03-24 DIAGNOSIS — Z79899 Other long term (current) drug therapy: Secondary | ICD-10-CM | POA: Diagnosis not present

## 2019-05-05 ENCOUNTER — Other Ambulatory Visit: Payer: Self-pay

## 2019-05-08 ENCOUNTER — Encounter: Payer: BLUE CROSS/BLUE SHIELD | Admitting: Internal Medicine

## 2019-05-16 ENCOUNTER — Other Ambulatory Visit: Payer: Self-pay | Admitting: Internal Medicine

## 2019-05-16 NOTE — Telephone Encounter (Signed)
Received refill request for Sonata 10mg  (1 capsule qhs prn) Last filled on 08/30/2018 for #30 with 5 refills by Dr. Annamaria Boots. Last seen 03/31/2018 by Dr. Annamaria Boots and advised to follow up in a year. No pending appt.   Dr. Annamaria Boots please advise. Thanks.   No Known Allergies  Current Outpatient Medications on File Prior to Visit  Medication Sig Dispense Refill  . Amphetamine ER (ADZENYS XR-ODT) 18.8 MG TBED Take 1 tablet by mouth daily.    Marland Kitchen buPROPion (WELLBUTRIN) 100 MG tablet Take 1 tablet (100 mg total) by mouth daily. 90 tablet 3  . escitalopram (LEXAPRO) 20 MG tablet Take 0.5 tablets (10 mg total) by mouth daily. 45 tablet 3  . tadalafil (CIALIS) 20 MG tablet TAKE ONE-HALF (1/2) TO ONE TABLET EVERY OTHER DAY AS NEEDED FOR ERECTILE DYSFUNCTION 24 tablet 3  . valACYclovir (VALTREX) 500 MG tablet Take 1 tablet (500 mg total) by mouth 2 (two) times daily. 30 tablet 1  . zaleplon (SONATA) 10 MG capsule TAKE ONE CAPSULE BY MOUTH FOR SLEEP IF NEEDED 30 capsule 5   No current facility-administered medications on file prior to visit.

## 2019-05-16 NOTE — Telephone Encounter (Signed)
Sonata refill e-sent. We won't be able to keep filling this without annual visit. Please make routine ROV next 3-4 months

## 2019-05-18 ENCOUNTER — Other Ambulatory Visit: Payer: Self-pay | Admitting: Internal Medicine

## 2019-05-18 MED ORDER — ESCITALOPRAM OXALATE 20 MG PO TABS: 10.0000 mg | ORAL_TABLET | Freq: Every day | ORAL | 0 refills | Status: DC

## 2019-05-18 NOTE — Telephone Encounter (Signed)
Medication Refill - Medication: escitalopram (LEXAPRO) 20 MG tablet  Pt has CPE sched for 05/30/2019, but needs rx refilled until appt time. Please advise.   Preferred Pharmacy:  [facility] - [place], [address] Phone:  [phone]  Fax:  [phone]       Agent: Please be advised that RX refills may take up to 3 business days. We ask that you follow-up with your pharmacy.

## 2019-05-18 NOTE — Telephone Encounter (Signed)
Sent medications in.

## 2019-05-29 ENCOUNTER — Other Ambulatory Visit: Payer: Self-pay

## 2019-05-30 ENCOUNTER — Other Ambulatory Visit: Payer: Self-pay

## 2019-05-30 ENCOUNTER — Ambulatory Visit (INDEPENDENT_AMBULATORY_CARE_PROVIDER_SITE_OTHER): Payer: BC Managed Care – PPO | Admitting: Internal Medicine

## 2019-05-30 ENCOUNTER — Encounter: Payer: Self-pay | Admitting: Internal Medicine

## 2019-05-30 VITALS — BP 114/74 | HR 67 | Temp 96.3°F | Resp 12 | Ht 75.0 in | Wt 246.8 lb

## 2019-05-30 DIAGNOSIS — F329 Major depressive disorder, single episode, unspecified: Secondary | ICD-10-CM

## 2019-05-30 DIAGNOSIS — G4733 Obstructive sleep apnea (adult) (pediatric): Secondary | ICD-10-CM | POA: Diagnosis not present

## 2019-05-30 DIAGNOSIS — R109 Unspecified abdominal pain: Secondary | ICD-10-CM

## 2019-05-30 DIAGNOSIS — Z23 Encounter for immunization: Secondary | ICD-10-CM | POA: Diagnosis not present

## 2019-05-30 DIAGNOSIS — Z0000 Encounter for general adult medical examination without abnormal findings: Secondary | ICD-10-CM | POA: Diagnosis not present

## 2019-05-30 DIAGNOSIS — Z0001 Encounter for general adult medical examination with abnormal findings: Secondary | ICD-10-CM

## 2019-05-30 DIAGNOSIS — F32A Depression, unspecified: Secondary | ICD-10-CM

## 2019-05-30 MED ORDER — ESCITALOPRAM OXALATE 20 MG PO TABS: 20.0000 mg | ORAL_TABLET | Freq: Every day | ORAL | 1 refills | Status: DC

## 2019-05-30 NOTE — Patient Instructions (Signed)
GO TO THE LAB : Get the blood work     GO TO THE FRONT DESK Schedule your next appointment   for checkup in 3 months  Proceed with a colonoscopy  Increase Lexapro to 1 tablet daily prescription sent

## 2019-05-30 NOTE — Progress Notes (Signed)
Subjective:    Patient ID: Larry Yu, male    DOB: 02-13-1966, 54 y.o.   MRN: [id number]  DOS:  05/30/2019 Type of visit - description: CPX Here for CPX, we also address other issues. -Anxiety, depression, adjust medications.  --Abdominal pain: long  history of abdominal pain, is sporadic, every 2 months, starts at the upper right abdomen then goes down to the suprapubic area. Chart reviewed, he has been previously seen by GI and surgery.    Wt Readings from Last 3 Encounters:  05/30/19 246 lb 12.8 oz (111.9 kg)  05/04/18 245 lb 6 oz (111.3 kg)  03/31/18 254 lb (115.2 kg)      Review of Systems Denies suicidal ideas No dysuria, gross hematuria or difficulty urinating   Other than above, a 14 point review of systems is negative    Past Medical History:  Diagnosis Date  . ADHD (attention deficit hyperactivity disorder)    see's Dr. Johnnye Sima  . Allergic rhinitis   . Depression 12/21/2012  . Hyperlipidemia   . OSA (obstructive sleep apnea) 05-2013    Past Surgical History:  Procedure Laterality Date  . NO PAST SURGERIES      Social History   Socioeconomic History  . Marital status: Divorced    Spouse name: Not on file  . Number of children: 2  . Years of education: Not on file  . Highest education level: Not on file  Occupational History  . Occupation: Training and development officer: [employer]  Tobacco Use  . Smoking status: Former Smoker    Packs/day: 1.00    Years: 13.00    Pack years: 13.00    Types: Cigarettes, Cigars    Quit date: 05/18/1993    Years since quitting: 26.0  . Smokeless tobacco: Never Used  . Tobacco comment: vaping   Substance and Sexual Activity  . Alcohol use: Yes    Alcohol/week: 0.0 standard drinks    Comment: socially 1-2 drinks ( 2 drinks 3x week)  . Drug use: No    Comment: QUIT 1995  . Sexual activity: Not on file  Other Topics Concern  . Not on file  Social History Narrative   Divorced, remarried 2013, divorced again, lives by  himself     2 children: ~ 1998, 2004 they live in [place] Determinants of Health   Financial Resource Strain:   . Difficulty of Paying Living Expenses: Not on file  Food Insecurity:   . Worried About Charity fundraiser in the Last Year: Not on file  . Ran Out of Food in the Last Year: Not on file  Transportation Needs:   . Lack of Transportation (Medical): Not on file  . Lack of Transportation (Non-Medical): Not on file  Physical Activity:   . Days of Exercise per Week: Not on file  . Minutes of Exercise per Session: Not on file  Stress:   . Feeling of Stress : Not on file  Social Connections:   . Frequency of Communication with Friends and Family: Not on file  . Frequency of Social Gatherings with Friends and Family: Not on file  . Attends Religious Services: Not on file  . Active Member of Clubs or Organizations: Not on file  . Attends Archivist Meetings: Not on file  . Marital Status: Not on file  Intimate Partner Violence:   . Fear of Current or Ex-Partner: Not on file  . Emotionally Abused: Not on file  .  Physically Abused: Not on file  . Sexually Abused: Not on file     Family History  Problem Relation Age of Onset  . Hypertension Mother   . Dementia Mother        dx age 19  . CAD Neg Hx   . Diabetes Neg Hx   . Colon cancer Neg Hx   . Prostate cancer Neg Hx   . Stroke Neg Hx     Allergies as of 05/30/2019   No Known Allergies     Medication List       Accurate as of May 30, 2019 11:59 PM. If you have any questions, ask your nurse or doctor.        Adzenys XR-ODT 18.8 MG Tbed Generic drug: Amphetamine ER Take 1 tablet by mouth daily.   buPROPion 100 MG tablet Commonly known as: WELLBUTRIN Take 1 tablet (100 mg total) by mouth daily.   escitalopram 20 MG tablet Commonly known as: LEXAPRO Take 1 tablet (20 mg total) by mouth daily. What changed: how much to take Changed by: Kathlene November, MD   tadalafil 20 MG tablet Commonly  known as: CIALIS TAKE ONE-HALF (1/2) TO ONE TABLET EVERY OTHER DAY AS NEEDED FOR ERECTILE DYSFUNCTION   valACYclovir 500 MG tablet Commonly known as: Valtrex Take 1 tablet (500 mg total) by mouth 2 (two) times daily.   zaleplon 10 MG capsule Commonly known as: SONATA TAKE ONE CAPSULE BY MOUTH DAILY FOR SLEEP IF NEEDED           Objective:   Physical Exam BP 114/74 (BP Location: Right Arm, Cuff Size: Large)   Pulse 67   Temp (!) 96.3 F (35.7 C) (Temporal)   Resp 12   Ht 6\' 3"  (1.905 m)   Wt 246 lb 12.8 oz (111.9 kg)   SpO2 98%   BMI 30.85 kg/m  General:   Well developed, NAD, BMI noted.  HEENT:  Normocephalic . Face symmetric, atraumatic Lungs:  CTA B Normal respiratory effort, no intercostal retractions, no accessory muscle use. Heart: RRR,  no murmur.  no pretibial edema bilaterally  Abdomen:  Not distended, soft, non-tender. No rebound or rigidity.   Skin: Not pale. Not jaundice Neurologic:  alert & oriented X3.  Speech normal, gait appropriate for age and unassisted Psych--  Cognition and judgment appear intact.  Cooperative with normal attention span and concentration.  Behavior appropriate. No anxious or depressed appearing.     Assessment    Assessment  Hyperlipidemia Depression-anxiety  Per PCP  ADD - formally dx by Dr Johnnye Sima  and started meds ~ 05-2015, f/u by [facility] Attention Specialist  OSA on CPAP , Dr Annamaria Boots , rarely takes sonata HSV  Allergic rhinitis Abdominal pain: Saw GI 2016, saw general surgery 07-2017.  History of right nonobstructive nephrolithiasis per [place] 2018  PLAN: Here for CPX Hyperlipidemia: Diet controlled, 10-year cardiovascular RF 5%.  Checking labs Depression anxiety: PHQ-9: 6, mild symptoms but he feels like he would benefit from a higher dose of Lexapro.  I am not opposed, will increase to 1 tablet daily, continue Wellbutrin. Reassess in 3 months OSA: Reports good compliance with CPAP Abdominal pain:  on and off problem  for years, starts at the right upper abdomen and radiate downwards.  Saw GI in 2016, they were not sure if symptoms were GI or due to possibly kidney stones.  Was RX a colonoscopy but he never pursued. 2018 had ultrasound with right nonobstructing nephrolithiasis.  07-2017: Saw general  surgery with similar symptoms, was Rx HIDA scan but the patient has not pursue. Now symptoms are sporadic. We are referring him for a routine colonoscopy, check a UA, reassess in 3 months RTC 3 months    Today in addition to his physical exam I address his chronic medical problems including hyperlipidemia, depression, anxiety, sleep apnea, abdominal pain.  Medications were adjusted.   This visit occurred during the SARS-CoV-2 public health emergency.  Safety protocols were in place, including screening questions prior to the visit, additional usage of staff PPE, and extensive cleaning of exam room while observing appropriate contact time as indicated for disinfecting solutions.

## 2019-05-31 LAB — CBC
HCT: 47.2 % (ref 39.0–52.0)
Hemoglobin: 15.9 g/dL (ref 13.0–17.0)
MCHC: 33.8 g/dL (ref 30.0–36.0)
MCV: 87 fl (ref 78.0–100.0)
Platelets: 225 10*3/uL (ref 150.0–400.0)
RBC: 5.42 Mil/uL (ref 4.22–5.81)
RDW: 13.4 % (ref 11.5–15.5)
WBC: 7.1 10*3/uL (ref 4.0–10.5)

## 2019-05-31 LAB — COMPREHENSIVE METABOLIC PANEL
ALT: 39 U/L (ref 0–53)
AST: 21 U/L (ref 0–37)
Albumin: 4.6 g/dL (ref 3.5–5.2)
Alkaline Phosphatase: 64 U/L (ref 39–117)
BUN: 14 mg/dL (ref 6–23)
CO2: 29 mEq/L (ref 19–32)
Calcium: 9.5 mg/dL (ref 8.4–10.5)
Chloride: 102 mEq/L (ref 96–112)
Creatinine, Ser: 1.32 mg/dL (ref 0.40–1.50)
GFR: 56.95 mL/min — ABNORMAL LOW (ref >60.00)
Glucose, Bld: 96 mg/dL (ref 70–99)
Potassium: 4.6 mEq/L (ref 3.5–5.1)
Sodium: 141 mEq/L (ref 135–145)
Total Bilirubin: 0.7 mg/dL (ref 0.2–1.2)
Total Protein: 7.3 g/dL (ref 6.0–8.3)

## 2019-05-31 LAB — LIPASE: Lipase: 33 U/L (ref 11.0–59.0)

## 2019-05-31 LAB — URINALYSIS, ROUTINE W REFLEX MICROSCOPIC
Bilirubin Urine: NEGATIVE
Hgb urine dipstick: NEGATIVE
Ketones, ur: NEGATIVE
Leukocytes,Ua: NEGATIVE
Nitrite: NEGATIVE
RBC / HPF: NONE SEEN (ref 0–?)
Specific Gravity, Urine: >=1.03 — AB (ref 1.000–1.030)
Total Protein, Urine: NEGATIVE
Urine Glucose: NEGATIVE
Urobilinogen, UA: 0.2 (ref 0.0–1.0)
pH: 5.5 (ref 5.0–8.0)

## 2019-05-31 LAB — TSH: TSH: 1.69 u[IU]/mL (ref 0.35–4.50)

## 2019-06-01 DIAGNOSIS — R109 Unspecified abdominal pain: Secondary | ICD-10-CM | POA: Insufficient documentation

## 2019-06-01 NOTE — Assessment & Plan Note (Signed)
Here for CPX Hyperlipidemia: Diet controlled, 10-year cardiovascular RF 5%.  Checking labs Depression anxiety: PHQ-9: 6, mild symptoms but he feels like he would benefit from a higher dose of Lexapro.  I am not opposed, will increase to 1 tablet daily, continue Wellbutrin. Reassess in 3 months OSA: Reports good compliance with CPAP Abdominal pain:  on and off problem for years, starts at the right upper abdomen and radiate downwards.  Saw GI in 2016, they were not sure if symptoms were GI or due to possibly kidney stones.  Was RX a colonoscopy but he never pursued. 2018 had ultrasound with right nonobstructing nephrolithiasis.  07-2017: Saw general surgery with similar symptoms, was Rx HIDA scan but the patient has not pursue. Now symptoms are sporadic. We are referring him for a routine colonoscopy, check a UA, reassess in 3 months RTC 3 months

## 2019-06-01 NOTE — Assessment & Plan Note (Signed)
-  Td 2012; flu shot today -CCS: Previous referral failed, will try again. -Prostate ca screening: DRE and PSA normal 2019. -Diet and exercise discussed. -Labs: CMP, FLP, CBC,   - Vaping: Vaping less than before.  Not ready to quit completely.

## 2019-06-15 NOTE — Telephone Encounter (Signed)
Called and left a voicemail for patient to make a f/u Appt  With Dr. Annamaria Boots in 3-4 months per Dr.Young for patient to get refills on his medication.

## 2019-07-28 ENCOUNTER — Ambulatory Visit: Payer: BC Managed Care – PPO | Attending: Internal Medicine

## 2019-07-28 DIAGNOSIS — Z23 Encounter for immunization: Secondary | ICD-10-CM

## 2019-07-28 NOTE — Progress Notes (Signed)
   Covid-19 Vaccination Clinic  Name:  NORAH LEBEL    MRN: [id number] DOB: 11/17/1965  07/28/2019  Mr. Stokley was observed post Covid-19 immunization for 15 minutes without incident. He was provided with Vaccine Information Sheet and instruction to access the V-Safe system.   Mr. Zeman was instructed to call 911 with any severe reactions post vaccine: Marland Kitchen Difficulty breathing  . Swelling of face and throat  . A fast heartbeat  . A bad rash all over body  . Dizziness and weakness   Immunizations Administered    Name Date Dose VIS Date Route   Pfizer COVID-19 Vaccine 07/28/2019 12:13 PM 0.3 mL 04/28/2019 Intramuscular   Manufacturer: [place]   Lot: [id number]   [place]: [id number]

## 2019-08-01 DIAGNOSIS — F900 Attention-deficit hyperactivity disorder, predominantly inattentive type: Secondary | ICD-10-CM | POA: Diagnosis not present

## 2019-08-01 DIAGNOSIS — F338 Other recurrent depressive disorders: Secondary | ICD-10-CM | POA: Diagnosis not present

## 2019-08-01 DIAGNOSIS — Z79899 Other long term (current) drug therapy: Secondary | ICD-10-CM | POA: Diagnosis not present

## 2019-08-02 DIAGNOSIS — Z79899 Other long term (current) drug therapy: Secondary | ICD-10-CM | POA: Diagnosis not present

## 2019-08-02 DIAGNOSIS — F900 Attention-deficit hyperactivity disorder, predominantly inattentive type: Secondary | ICD-10-CM | POA: Diagnosis not present

## 2019-08-22 ENCOUNTER — Ambulatory Visit: Payer: BC Managed Care – PPO | Attending: Internal Medicine

## 2019-08-22 DIAGNOSIS — Z23 Encounter for immunization: Secondary | ICD-10-CM

## 2019-08-22 NOTE — Progress Notes (Signed)
   Covid-19 Vaccination Clinic  Name:  Larry Yu    MRN: [id number] DOB: [birth year]  08/22/2019  [name] was observed post Covid-19 immunization for 15 minutes without incident. He was provided with Vaccine Information Sheet and instruction to access the V-Safe system.   [name] was instructed to call 911 with any severe reactions post vaccine: Marland Kitchen Difficulty breathing  . Swelling of face and throat  . A fast heartbeat  . A bad rash all over body  . Dizziness and weakness   Immunizations Administered    Name Date Dose VIS Date Route   Pfizer COVID-19 Vaccine 08/22/2019 12:34 PM 0.3 mL 04/28/2019 Intramuscular   Manufacturer: [place]   Lot: [id number]   [place]: [id number]

## 2019-08-31 ENCOUNTER — Ambulatory Visit: Payer: BC Managed Care – PPO | Admitting: Internal Medicine

## 2019-08-31 DIAGNOSIS — Z0289 Encounter for other administrative examinations: Secondary | ICD-10-CM

## 2019-09-01 ENCOUNTER — Ambulatory Visit: Payer: BC Managed Care – PPO | Admitting: Internal Medicine

## 2019-10-17 ENCOUNTER — Other Ambulatory Visit: Payer: Self-pay | Admitting: Internal Medicine

## 2019-10-30 DIAGNOSIS — F900 Attention-deficit hyperactivity disorder, predominantly inattentive type: Secondary | ICD-10-CM | POA: Diagnosis not present

## 2019-10-30 DIAGNOSIS — F338 Other recurrent depressive disorders: Secondary | ICD-10-CM | POA: Diagnosis not present

## 2019-10-30 DIAGNOSIS — Z79899 Other long term (current) drug therapy: Secondary | ICD-10-CM | POA: Diagnosis not present

## 2020-01-24 DIAGNOSIS — F900 Attention-deficit hyperactivity disorder, predominantly inattentive type: Secondary | ICD-10-CM | POA: Diagnosis not present

## 2020-01-24 DIAGNOSIS — F338 Other recurrent depressive disorders: Secondary | ICD-10-CM | POA: Diagnosis not present

## 2020-01-24 DIAGNOSIS — Z79899 Other long term (current) drug therapy: Secondary | ICD-10-CM | POA: Diagnosis not present

## 2020-02-04 ENCOUNTER — Other Ambulatory Visit: Payer: Self-pay | Admitting: Internal Medicine

## 2020-03-18 ENCOUNTER — Other Ambulatory Visit: Payer: Self-pay

## 2020-03-18 ENCOUNTER — Encounter: Payer: Self-pay | Admitting: Internal Medicine

## 2020-03-18 ENCOUNTER — Ambulatory Visit (INDEPENDENT_AMBULATORY_CARE_PROVIDER_SITE_OTHER): Payer: BC Managed Care – PPO | Admitting: Internal Medicine

## 2020-03-18 VITALS — BP 111/74 | HR 71 | Temp 98.1°F | Resp 18 | Ht 75.0 in | Wt 232.0 lb

## 2020-03-18 DIAGNOSIS — G4700 Insomnia, unspecified: Secondary | ICD-10-CM | POA: Diagnosis not present

## 2020-03-18 DIAGNOSIS — F32A Depression, unspecified: Secondary | ICD-10-CM | POA: Diagnosis not present

## 2020-03-18 DIAGNOSIS — G4733 Obstructive sleep apnea (adult) (pediatric): Secondary | ICD-10-CM

## 2020-03-18 DIAGNOSIS — E785 Hyperlipidemia, unspecified: Secondary | ICD-10-CM | POA: Diagnosis not present

## 2020-03-18 DIAGNOSIS — Z23 Encounter for immunization: Secondary | ICD-10-CM

## 2020-03-18 MED ORDER — ESCITALOPRAM OXALATE 20 MG PO TABS: 20.0000 mg | ORAL_TABLET | Freq: Every day | ORAL | 1 refills | Status: DC

## 2020-03-18 MED ORDER — BUPROPION HCL 100 MG PO TABS: 100.0000 mg | ORAL_TABLET | Freq: Every day | ORAL | 1 refills | Status: DC

## 2020-03-18 NOTE — Patient Instructions (Addendum)
    GO TO THE FRONT DESK, PLEASE SCHEDULE YOUR APPOINTMENTS Come back for a physical exam by January 2022, fasting

## 2020-03-18 NOTE — Progress Notes (Signed)
Subjective:    Patient ID: Larry Yu, male    DOB: 1965-07-06, 54 y.o.   MRN: [id number]  DOS:  03/18/2020 Type of visit - description: Routine checkup Since the last office visit he is doing well.  Lexapro dose increased, feeling well emotionally. Uses CPAP, still has episodic difficulty sleeping.   Review of Systems Denies depression or anxiety. Past Medical History:  Diagnosis Date  . ADHD (attention deficit hyperactivity disorder)    see's Dr. Johnnye Sima  . Allergic rhinitis   . Depression 12/21/2012  . Hyperlipidemia   . OSA (obstructive sleep apnea) 05-2013    Past Surgical History:  Procedure Laterality Date  . NO PAST SURGERIES      Allergies as of 03/18/2020   No Known Allergies     Medication List       Accurate as of March 18, 2020  3:29 PM. If you have any questions, ask your nurse or doctor.        Adzenys XR-ODT 18.8 MG Tbed Generic drug: Amphetamine ER Take 1 tablet by mouth daily.   buPROPion 100 MG tablet Commonly known as: WELLBUTRIN Take 1 tablet (100 mg total) by mouth daily.   escitalopram 20 MG tablet Commonly known as: LEXAPRO Take 1 tablet (20 mg total) by mouth daily.   tadalafil 20 MG tablet Commonly known as: CIALIS TAKE ONE-HALF (1/2) TO ONE TABLET EVERY OTHER DAY AS NEEDED FOR ERECTILE DYSFUNCTION   valACYclovir 500 MG tablet Commonly known as: Valtrex Take 1 tablet (500 mg total) by mouth 2 (two) times daily.   zaleplon 10 MG capsule Commonly known as: SONATA TAKE ONE CAPSULE BY MOUTH DAILY FOR SLEEP IF NEEDED          Objective:   Physical Exam BP 111/74 (BP Location: Left Arm, Patient Position: Sitting, Cuff Size: Normal)   Pulse 71   Temp 98.1 F (36.7 C) (Oral)   Resp 18   Ht 6\' 3"  (1.905 m)   Wt 232 lb (105.2 kg)   SpO2 99%   BMI 29.00 kg/m   General:   Well developed, NAD, BMI noted. HEENT:  Normocephalic . Face symmetric, atraumatic Lungs:  CTA B Normal respiratory effort, no intercostal  retractions, no accessory muscle use. Heart: RRR,  no murmur.  Lower extremities: no pretibial edema bilaterally  Skin: Not pale. Not jaundice Neurologic:  alert & oriented X3.  Speech normal, gait appropriate for age and unassisted Psych--  Cognition and judgment appear intact.  Cooperative with normal attention span and concentration.  Behavior appropriate. No anxious or depressed appearing.      Assessment     Assessment  Hyperlipidemia Depression-anxiety  Per PCP  ADD - formally dx by Dr Johnnye Sima  and started meds ~ 05-2015, f/u by [facility] Attention Specialist  OSA on CPAP , Dr Annamaria Boots , rarely takes sonata HSV  Allergic rhinitis Abdominal pain: Saw GI 2016, saw general surgery 07-2017.  History of right nonobstructive nephrolithiasis per [place] 2018  PLAN: Hyperlipidemia: Diet controlled, my intention was to check a cholesterol panel a few months ago but that was not done.  He is not fasting today, will recheck in a couple of months when he comes back. Depression anxiety, Lexapro dose increased last time, he is also on Wellbutrin, well-controlled, refill medicines.  PHQ-9 score 3. OSA, insomnia: Good compliance with CPAP, occasionally uses Sonata, plans to see pulmonary at some point. Preventive care: Flu shot today, encouraged to have a booster for Covid at his  earliest convenience. RTC 05/2020 CPX  This visit occurred during the SARS-CoV-2 public health emergency.  Safety protocols were in place, including screening questions prior to the visit, additional usage of staff PPE, and extensive cleaning of exam room while observing appropriate contact time as indicated for disinfecting solutions.

## 2020-03-18 NOTE — Progress Notes (Signed)
Pre visit review using our clinic review tool, if applicable. No additional management support is needed unless otherwise documented below in the visit note. 

## 2020-03-19 DIAGNOSIS — E785 Hyperlipidemia, unspecified: Secondary | ICD-10-CM | POA: Insufficient documentation

## 2020-03-19 NOTE — Assessment & Plan Note (Signed)
Hyperlipidemia: Diet controlled, my intention was to check a cholesterol panel a few months ago but that was not done.  He is not fasting today, will recheck in a couple of months when he comes back. Depression anxiety, Lexapro dose increased last time, he is also on Wellbutrin, well-controlled, refill medicines.  PHQ-9 score 3. OSA, insomnia: Good compliance with CPAP, occasionally uses Sonata, plans to see pulmonary at some point. Preventive care: Flu shot today, encouraged to have a booster for Covid at his earliest convenience. RTC 05/2020 CPX

## 2020-04-26 DIAGNOSIS — F419 Anxiety disorder, unspecified: Secondary | ICD-10-CM | POA: Diagnosis not present

## 2020-04-26 DIAGNOSIS — F902 Attention-deficit hyperactivity disorder, combined type: Secondary | ICD-10-CM | POA: Diagnosis not present

## 2020-04-26 DIAGNOSIS — F338 Other recurrent depressive disorders: Secondary | ICD-10-CM | POA: Diagnosis not present

## 2020-04-26 DIAGNOSIS — Z79899 Other long term (current) drug therapy: Secondary | ICD-10-CM | POA: Diagnosis not present

## 2020-05-26 DIAGNOSIS — Z1152 Encounter for screening for COVID-19: Secondary | ICD-10-CM | POA: Diagnosis not present

## 2020-06-04 ENCOUNTER — Encounter: Payer: Self-pay | Admitting: Internal Medicine

## 2020-06-04 ENCOUNTER — Ambulatory Visit (INDEPENDENT_AMBULATORY_CARE_PROVIDER_SITE_OTHER): Payer: BC Managed Care – PPO | Admitting: Internal Medicine

## 2020-06-04 ENCOUNTER — Other Ambulatory Visit: Payer: Self-pay

## 2020-06-04 VITALS — BP 114/81 | HR 79 | Temp 98.2°F | Resp 18 | Ht 75.0 in | Wt 228.2 lb

## 2020-06-04 DIAGNOSIS — Z0000 Encounter for general adult medical examination without abnormal findings: Secondary | ICD-10-CM | POA: Diagnosis not present

## 2020-06-04 DIAGNOSIS — Z1211 Encounter for screening for malignant neoplasm of colon: Secondary | ICD-10-CM | POA: Diagnosis not present

## 2020-06-04 DIAGNOSIS — Z1159 Encounter for screening for other viral diseases: Secondary | ICD-10-CM

## 2020-06-04 DIAGNOSIS — E785 Hyperlipidemia, unspecified: Secondary | ICD-10-CM

## 2020-06-04 LAB — COMPREHENSIVE METABOLIC PANEL
ALT: 27 U/L (ref 0–53)
AST: 15 U/L (ref 0–37)
Albumin: 4.7 g/dL (ref 3.5–5.2)
Alkaline Phosphatase: 63 U/L (ref 39–117)
BUN: 16 mg/dL (ref 6–23)
CO2: 31 mEq/L (ref 19–32)
Calcium: 9.7 mg/dL (ref 8.4–10.5)
Chloride: 102 mEq/L (ref 96–112)
Creatinine, Ser: 1.17 mg/dL (ref 0.40–1.50)
GFR: 71.39 mL/min (ref >60.00)
Glucose, Bld: 95 mg/dL (ref 70–99)
Potassium: 4.7 mEq/L (ref 3.5–5.1)
Sodium: 138 mEq/L (ref 135–145)
Total Bilirubin: 0.7 mg/dL (ref 0.2–1.2)
Total Protein: 7 g/dL (ref 6.0–8.3)

## 2020-06-04 LAB — CBC WITH DIFFERENTIAL/PLATELET
Basophils Absolute: 0.1 10*3/uL (ref 0.0–0.1)
Basophils Relative: 1.3 % (ref 0.0–3.0)
Eosinophils Absolute: 0.1 10*3/uL (ref 0.0–0.7)
Eosinophils Relative: 1.8 % (ref 0.0–5.0)
HCT: 49.1 % (ref 39.0–52.0)
Hemoglobin: 16.5 g/dL (ref 13.0–17.0)
Lymphocytes Relative: 22.2 % (ref 12.0–46.0)
Lymphs Abs: 1.7 10*3/uL (ref 0.7–4.0)
MCHC: 33.6 g/dL (ref 30.0–36.0)
MCV: 87.3 fl (ref 78.0–100.0)
Monocytes Absolute: 0.6 10*3/uL (ref 0.1–1.0)
Monocytes Relative: 7.1 % (ref 3.0–12.0)
Neutro Abs: 5.3 10*3/uL (ref 1.4–7.7)
Neutrophils Relative %: 67.6 % (ref 43.0–77.0)
Platelets: 266 10*3/uL (ref 150.0–400.0)
RBC: 5.63 Mil/uL (ref 4.22–5.81)
RDW: 13.3 % (ref 11.5–15.5)
WBC: 7.8 10*3/uL (ref 4.0–10.5)

## 2020-06-04 LAB — LIPID PANEL
Cholesterol: 204 mg/dL — ABNORMAL HIGH (ref 0–200)
HDL: 31.5 mg/dL — ABNORMAL LOW (ref >39.00)
NonHDL: 172.6
Total CHOL/HDL Ratio: 6
Triglycerides: 253 mg/dL — ABNORMAL HIGH (ref 0.0–149.0)
VLDL: 50.6 mg/dL — ABNORMAL HIGH (ref 0.0–40.0)

## 2020-06-04 LAB — LDL CHOLESTEROL, DIRECT: Direct LDL: 126 mg/dL

## 2020-06-04 LAB — PSA: PSA: 0.36 ng/mL (ref 0.10–4.00)

## 2020-06-04 NOTE — Assessment & Plan Note (Signed)
-  Td 2012 - covid vax x 2, rec booster  - had a flu shot   -CCS: Previous referrals for colonoscopy failed, detailed discussion, elected Cologuard.  Rec to be sure the test is covered by his insurance -Prostate ca screening:DRE normal, check a PSA -Diet and exercise: Doing well, has changed his diet, I noted some weight loss.  Praised -Labs: CMP, FLP, CBC, PSA, hep C (nonfasting) -Vaping:   Not ready to quit completely, encouraged to think about.

## 2020-06-04 NOTE — Progress Notes (Signed)
Pre visit review using our clinic review tool, if applicable. No additional management support is needed unless otherwise documented below in the visit note. 

## 2020-06-04 NOTE — Patient Instructions (Signed)
Consider check a COVID booster  Before you return the Cologuard sample, check with your insurance for coverage  GO TO THE LAB : Get the blood work     [place], [place] back for a physical in 1 year.

## 2020-06-04 NOTE — Progress Notes (Signed)
Subjective:    Patient ID: Larry Yu, male    DOB: May 31, 1965, 55 y.o.   MRN: [id number]  DOS:  06/04/2020 Type of visit - description: CPX  In general feels well.  No major concerns.  Wt Readings from Last 3 Encounters:  06/04/20 228 lb 4 oz (103.5 kg)  03/18/20 232 lb (105.2 kg)  05/30/19 246 lb 12.8 oz (111.9 kg)     Review of Systems   A 14 point review of systems is negative    Past Medical History:  Diagnosis Date  . ADHD (attention deficit hyperactivity disorder)    see's Dr. Johnnye Sima  . Allergic rhinitis   . Depression 12/21/2012  . Hyperlipidemia   . OSA (obstructive sleep apnea) 05-2013    Past Surgical History:  Procedure Laterality Date  . NO PAST SURGERIES      Allergies as of 06/04/2020   No Known Allergies     Medication List       Accurate as of June 04, 2020  9:26 PM. If you have any questions, ask your nurse or doctor.        Amphetamine ER 18.8 MG Tbed Take 1 tablet by mouth daily.   buPROPion 100 MG tablet Commonly known as: WELLBUTRIN Take 1 tablet (100 mg total) by mouth daily.   escitalopram 20 MG tablet Commonly known as: LEXAPRO Take 1 tablet (20 mg total) by mouth daily.   tadalafil 20 MG tablet Commonly known as: CIALIS TAKE ONE-HALF (1/2) TO ONE TABLET EVERY OTHER DAY AS NEEDED FOR ERECTILE DYSFUNCTION   valACYclovir 500 MG tablet Commonly known as: Valtrex Take 1 tablet (500 mg total) by mouth 2 (two) times daily.   zaleplon 10 MG capsule Commonly known as: SONATA TAKE ONE CAPSULE BY MOUTH DAILY FOR SLEEP IF NEEDED          Objective:   Physical Exam BP 114/81 (BP Location: Left Arm, Patient Position: Sitting, Cuff Size: Normal)   Pulse 79   Temp 98.2 F (36.8 C) (Oral)   Resp 18   Ht 6\' 3"  (1.905 m)   Wt 228 lb 4 oz (103.5 kg)   SpO2 94%   BMI 28.53 kg/m  General: Well developed, NAD, BMI noted Neck: No  thyromegaly  HEENT:  Normocephalic . Face symmetric, atraumatic Lungs:  CTA B Normal  respiratory effort, no intercostal retractions, no accessory muscle use. Heart: RRR,  no murmur.  Abdomen:  Not distended, soft, non-tender. No rebound or rigidity.   Lower extremities: no pretibial edema bilaterally  DRE: Normal sphincter tone, no stools, prostate normal. Skin: Exposed areas without rash. Not pale. Not jaundice Neurologic:  alert & oriented X3.  Speech normal, gait appropriate for age and unassisted Strength symmetric and appropriate for age.  Psych: Cognition and judgment appear intact.  Cooperative with normal attention span and concentration.  Behavior appropriate. No anxious or depressed appearing.     Assessment     Assessment  Hyperlipidemia Depression-anxiety  Per PCP  ADD - formally dx by Dr Johnnye Sima  and started meds ~ 05-2015, f/u by [facility] Attention Specialist  OSA on CPAP , Dr Annamaria Boots , rarely takes sonata HSV  Allergic rhinitis Abdominal pain: Saw GI 2016, saw general surgery 07-2017.  History of right nonobstructive nephrolithiasis per [place] 2018  PLAN: Here for CPX Hyperlipidemia: Diet controlled, not fasting, check FLP.  Has changed his diet, has cut down on red meats.  Current 10-year CV RF: 5.5% Depression, anxiety,  insomnia: PHQ-9  today is 7, controlled, continue Sonata, Wellbutrin, Lexapro. ADD: Managed elsewhere RTC 1 year   This visit occurred during the SARS-CoV-2 public health emergency.  Safety protocols were in place, including screening questions prior to the visit, additional usage of staff PPE, and extensive cleaning of exam room while observing appropriate contact time as indicated for disinfecting solutions.

## 2020-06-04 NOTE — Assessment & Plan Note (Signed)
Here for CPX Hyperlipidemia: Diet controlled, not fasting, check FLP.  Has changed his diet, has cut down on red meats.  Current 10-year CV RF: 5.5% Depression, anxiety,  insomnia: PHQ-9 today is 7, controlled, continue Sonata, Wellbutrin, Lexapro. ADD: Managed elsewhere RTC 1 year

## 2020-06-05 LAB — HEPATITIS C ANTIBODY
Hepatitis C Ab: NONREACTIVE
SIGNAL TO CUT-OFF: 0.01 (ref <1.00)

## 2020-06-14 ENCOUNTER — Other Ambulatory Visit (HOSPITAL_BASED_OUTPATIENT_CLINIC_OR_DEPARTMENT_OTHER): Payer: Self-pay | Admitting: Internal Medicine

## 2020-06-14 ENCOUNTER — Ambulatory Visit: Payer: BC Managed Care – PPO | Attending: Internal Medicine

## 2020-06-14 DIAGNOSIS — Z23 Encounter for immunization: Secondary | ICD-10-CM

## 2020-06-14 MED FILL — PFIZER-BIONTECH COVID-19 VA: 30 | 21 days supply | Qty: 0 | Fill #0

## 2020-06-14 NOTE — Progress Notes (Signed)
   Covid-19 Vaccination Clinic  Name:  Larry Yu    MRN: [id number] DOB: November 20, 1965  06/14/2020  Larry Yu was observed post Covid-19 immunization for 15 minutes without incident. He was provided with Vaccine Information Sheet and instruction to access the V-Safe system.   Larry Yu was instructed to call 911 with any severe reactions post vaccine: Marland Kitchen Difficulty breathing  . Swelling of face and throat  . A fast heartbeat  . A bad rash all over body  . Dizziness and weakness   Immunizations Administered    Name Date Dose VIS Date Route   Pfizer COVID-19 Vaccine 06/14/2020  2:24 PM 0.3 mL 03/06/2020 Intramuscular   Manufacturer: [place]   Lot: [id number]   [place]: [id number]

## 2020-06-20 ENCOUNTER — Telehealth: Payer: BC Managed Care – PPO | Admitting: Family Medicine

## 2020-06-20 ENCOUNTER — Encounter: Payer: Self-pay | Admitting: Family Medicine

## 2020-06-20 DIAGNOSIS — R0981 Nasal congestion: Secondary | ICD-10-CM | POA: Insufficient documentation

## 2020-06-20 DIAGNOSIS — R059 Cough, unspecified: Secondary | ICD-10-CM | POA: Diagnosis not present

## 2020-06-20 NOTE — Progress Notes (Signed)
Larry Yu, freer are scheduled for a virtual visit with your provider today.    Just as we do with appointments in the office, we must obtain your consent to participate.  Your consent will be active for this visit and any virtual visit you may have with one of our providers in the next 365 days.    If you have a MyChart account, I can also send a copy of this consent to you electronically.  All virtual visits are billed to your insurance company just like a traditional visit in the office.  As this is a virtual visit, video technology does not allow for your provider to perform a traditional examination.  This may limit your provider's ability to fully assess your condition.  If your provider identifies any concerns that need to be evaluated in person or the need to arrange testing such as labs, EKG, etc, we will make arrangements to do so.    Although advances in technology are sophisticated, we cannot ensure that it will always work on either your end or our end.  If the connection with a video visit is poor, we may have to switch to a telephone visit.  With either a video or telephone visit, we are not always able to ensure that we have a secure connection.   I need to obtain your verbal consent now.   Are you willing to proceed with your visit today?   [name] has provided verbal consent on 06/20/2020 for a virtual visit (video or telephone).   Perlie Mayo, NP 06/20/2020  9:13 AM   Date:  06/20/2020   ID:  Larry Yu, DOB 25-Sep-1965, MRN [id number]  Patient Location: Home Provider Location: Other:  Home Office   Participants: Patient and Provider for Visit and Wrap up  Method of visit: Video  Location of Patient: Home Location of Provider: Home Office Consent was obtain for visit over the video. Services rendered by provider: Visit was performed via video  A video enabled telemedicine application was used and I verified that I am speaking with the correct person using two  identifiers.  PCP:  Colon Branch, MD   Chief Complaint:  Got booster, still having symptoms  History of Present Illness:    Larry Yu is a 55 y.o. male with history as stated below. Presents video telehealth for an acute care visit got booster on 1/28. Reports he developed symptoms that felt like flu on 1/31- assumed it was from booster.  Today reports linger nasal congestion and mild cough, but is feeling better.  He needs a return to work note due to symptoms. Unsure of exposure prior to booster, did not have symptoms at time of booster. No OTC tried at this time as he as slowly started getting better.  Is willing to get covid tested to make sure it was not covid and was related to booster immune response.  Past Medical, Surgical, Social History, Allergies, and Medications have been Reviewed.  Past Medical History:  Diagnosis Date  . ADHD (attention deficit hyperactivity disorder)    see's Dr. Johnnye Sima  . Allergic rhinitis   . Depression 12/21/2012  . Hyperlipidemia   . OSA (obstructive sleep apnea) 05-2013    No outpatient medications have been marked as taking for the 06/20/20 encounter (Appointment) with Perlie Mayo, NP.     Allergies:   Patient has no known allergies.   ROS See HPI for history of present illness.  Physical Exam  Constitutional:      Appearance: Normal appearance.  HENT:     Head: Normocephalic and atraumatic.     Right Ear: External ear normal.     Left Ear: External ear normal.     Nose: Nose normal.     Comments: Nasal congestion tone noted Eyes:     Extraocular Movements: Extraocular movements intact.     Conjunctiva/sclera: Conjunctivae normal.     Pupils: Pupils are equal, round, and reactive to light.  Pulmonary:     Comments: No shortness of breath or cough in conversation Musculoskeletal:        General: Normal range of motion.     Cervical back: Normal range of motion.  Neurological:     Mental Status: He is alert and oriented to  person, place, and time.  Psychiatric:        Mood and Affect: Mood normal.        Behavior: Behavior normal.        Thought Content: Thought content normal.        Judgment: Judgment normal.               1. Cough -linger cough, but improving -most likely booster immune response related -advised covid testing to rule out exposure prior to booster -needs work note due to symptoms, will need covid neg test prior to work note -will send results via mychart and work note will be provided -advised symptom management   2. Nasal congestion -most likely booster immune response related -advised covid testing to rule out exposure prior to booster -work note provided based off results  Time:   Today, I have spent 11 minutes with the patient with telehealth technology discussing the above problems, reviewing the chart, previous notes, medications and orders.    Tests Ordered: No orders of the defined types were placed in this encounter.   Medication Changes: No orders of the defined types were placed in this encounter.    Disposition:  Follow up as needed Signed, Perlie Mayo, NP  06/20/2020 9:13 AM

## 2020-06-20 NOTE — Patient Instructions (Signed)
I appreciate the opportunity to provide you with care for your health and wellness.  Follow up:  As needed   Please send in results of covid test on mychart and I will get you a work note for return.  Please continue to practice social distancing to keep you, your family, and our community safe.  If you must go out, please wear a mask and practice good handwashing.

## 2020-07-25 DIAGNOSIS — G4733 Obstructive sleep apnea (adult) (pediatric): Secondary | ICD-10-CM | POA: Diagnosis not present

## 2020-07-25 DIAGNOSIS — F902 Attention-deficit hyperactivity disorder, combined type: Secondary | ICD-10-CM | POA: Diagnosis not present

## 2020-07-25 DIAGNOSIS — F419 Anxiety disorder, unspecified: Secondary | ICD-10-CM | POA: Diagnosis not present

## 2020-07-25 DIAGNOSIS — Z79899 Other long term (current) drug therapy: Secondary | ICD-10-CM | POA: Diagnosis not present

## 2020-08-19 ENCOUNTER — Telehealth: Payer: Self-pay | Admitting: Internal Medicine

## 2020-08-19 MED ORDER — BUPROPION HCL 100 MG PO TABS: 100.0000 mg | ORAL_TABLET | Freq: Every day | ORAL | 3 refills | Status: DC

## 2020-08-19 NOTE — Telephone Encounter (Signed)
Rx sent 

## 2020-08-19 NOTE — Telephone Encounter (Signed)
Medication: buPROPion (WELLBUTRIN) 100 MG tablet [[id number]]      Has the patient contacted their pharmacy? NO (If no, request that the patient contact the pharmacy for the refill.) (If yes, when and what did the pharmacy advise?)    Preferred Pharmacy (with phone number or street name):   [facility], [address] [place], [place] [address]    Agent: Please be advised that RX refills may take up to 3 business days. We ask that you follow-up with your pharmacy.

## 2021-02-03 ENCOUNTER — Other Ambulatory Visit (HOSPITAL_COMMUNITY): Payer: Self-pay

## 2021-02-17 ENCOUNTER — Other Ambulatory Visit: Payer: Self-pay | Admitting: Internal Medicine

## 2021-06-06 ENCOUNTER — Ambulatory Visit (INDEPENDENT_AMBULATORY_CARE_PROVIDER_SITE_OTHER): Payer: BC Managed Care – PPO | Admitting: Internal Medicine

## 2021-06-06 ENCOUNTER — Encounter: Payer: Self-pay | Admitting: Internal Medicine

## 2021-06-06 VITALS — BP 124/72 | HR 61 | Temp 98.0°F | Resp 18 | Ht 75.0 in | Wt 235.0 lb

## 2021-06-06 DIAGNOSIS — G4733 Obstructive sleep apnea (adult) (pediatric): Secondary | ICD-10-CM | POA: Diagnosis not present

## 2021-06-06 DIAGNOSIS — E785 Hyperlipidemia, unspecified: Secondary | ICD-10-CM | POA: Diagnosis not present

## 2021-06-06 DIAGNOSIS — Z0000 Encounter for general adult medical examination without abnormal findings: Secondary | ICD-10-CM

## 2021-06-06 DIAGNOSIS — Z1211 Encounter for screening for malignant neoplasm of colon: Secondary | ICD-10-CM | POA: Diagnosis not present

## 2021-06-06 DIAGNOSIS — Z23 Encounter for immunization: Secondary | ICD-10-CM

## 2021-06-06 LAB — COMPREHENSIVE METABOLIC PANEL
ALT: 25 U/L (ref 0–53)
AST: 16 U/L (ref 0–37)
Albumin: 4.6 g/dL (ref 3.5–5.2)
Alkaline Phosphatase: 62 U/L (ref 39–117)
BUN: 16 mg/dL (ref 6–23)
CO2: 32 mEq/L (ref 19–32)
Calcium: 9.4 mg/dL (ref 8.4–10.5)
Chloride: 105 mEq/L (ref 96–112)
Creatinine, Ser: 1.12 mg/dL (ref 0.40–1.50)
GFR: 74.71 mL/min (ref >60.00)
Glucose, Bld: 91 mg/dL (ref 70–99)
Potassium: 4.7 mEq/L (ref 3.5–5.1)
Sodium: 141 mEq/L (ref 135–145)
Total Bilirubin: 0.6 mg/dL (ref 0.2–1.2)
Total Protein: 6.9 g/dL (ref 6.0–8.3)

## 2021-06-06 LAB — CBC WITH DIFFERENTIAL/PLATELET
Basophils Absolute: 0 10*3/uL (ref 0.0–0.1)
Basophils Relative: 0.6 % (ref 0.0–3.0)
Eosinophils Absolute: 0.1 10*3/uL (ref 0.0–0.7)
Eosinophils Relative: 2.5 % (ref 0.0–5.0)
HCT: 46 % (ref 39.0–52.0)
Hemoglobin: 15.3 g/dL (ref 13.0–17.0)
Lymphocytes Relative: 25.2 % (ref 12.0–46.0)
Lymphs Abs: 1.4 10*3/uL (ref 0.7–4.0)
MCHC: 33.2 g/dL (ref 30.0–36.0)
MCV: 87.1 fl (ref 78.0–100.0)
Monocytes Absolute: 0.5 10*3/uL (ref 0.1–1.0)
Monocytes Relative: 8.6 % (ref 3.0–12.0)
Neutro Abs: 3.5 10*3/uL (ref 1.4–7.7)
Neutrophils Relative %: 63.1 % (ref 43.0–77.0)
Platelets: 246 10*3/uL (ref 150.0–400.0)
RBC: 5.28 Mil/uL (ref 4.22–5.81)
RDW: 13 % (ref 11.5–15.5)
WBC: 5.6 10*3/uL (ref 4.0–10.5)

## 2021-06-06 LAB — LIPID PANEL
Cholesterol: 203 mg/dL — ABNORMAL HIGH (ref 0–200)
HDL: 35.2 mg/dL — ABNORMAL LOW (ref >39.00)
LDL Cholesterol: 131 mg/dL — ABNORMAL HIGH (ref 0–99)
NonHDL: 168.16
Total CHOL/HDL Ratio: 6
Triglycerides: 184 mg/dL — ABNORMAL HIGH (ref 0.0–149.0)
VLDL: 36.8 mg/dL (ref 0.0–40.0)

## 2021-06-06 NOTE — Progress Notes (Signed)
Subjective:    Patient ID: Larry Yu, male    DOB: [birth year], 56 y.o.   MRN: [id number]  DOS:  06/06/2021 Type of visit - description: CPX Here for CPX. Since the last office visit is doing well and has no major concerns.   Review of Systems     A 14 point review of systems is negative    Past Medical History:  Diagnosis Date   ADHD (attention deficit hyperactivity disorder)    see's Dr. Johnnye Sima   Allergic rhinitis    Depression 12/21/2012   Hyperlipidemia    OSA (obstructive sleep apnea) 05-2013    Past Surgical History:  Procedure Laterality Date   NO PAST SURGERIES     Social History   Socioeconomic History   Marital status: Divorced    Spouse name: Not on file   Number of children: 2   Years of education: Not on file   Highest education level: Not on file  Occupational History   Occupation: Training and development officer: [employer]  Tobacco Use   Smoking status: Former    Packs/day: 1.00    Years: 13.00    Pack years: 13.00    Types: Cigarettes, Cigars    Quit date: 05/18/1993    Years since quitting: 28.0   Smokeless tobacco: Never   Tobacco comments:    vaping - has cut back   Vaping Use   Vaping Use: Every day  Substance and Sexual Activity   Alcohol use: Not Currently   Drug use: No    Comment: [place]   Sexual activity: Not on file  Other Topics Concern   Not on file  Social History Narrative   Divorced, remarried 2013, divorced again, lives by himself     2 children: ~ 1998, 2004 they live in [place] Determinants of Health   Financial Resource Strain: Not on file  Food Insecurity: Not on file  Transportation Needs: Not on file  Physical Activity: Not on file  Stress: Not on file  Social Connections: Not on file  Intimate Partner Violence: Not on file    Current Outpatient Medications  Medication Instructions   Amphetamine ER 18.8 MG TBED 1 tablet, Oral, Daily   buPROPion (WELLBUTRIN) 100 MG tablet TAKE 1 TABLET DAILY    escitalopram (LEXAPRO) 20 MG tablet TAKE 1 TABLET DAILY   tadalafil (CIALIS) 20 MG tablet TAKE ONE-HALF (1/2) TO ONE TABLET EVERY OTHER DAY AS NEEDED FOR ERECTILE DYSFUNCTION   valACYclovir (VALTREX) 500 mg, Oral, 2 times daily   zaleplon (SONATA) 10 MG capsule TAKE ONE CAPSULE BY MOUTH DAILY FOR SLEEP IF NEEDED       Objective:   Physical Exam BP 124/72 (BP Location: Left Arm, Patient Position: Sitting, Cuff Size: Normal)    Pulse 61    Temp 98 F (36.7 C) (Oral)    Resp 18    Ht 6\' 3"  (1.905 m)    Wt 235 lb (106.6 kg)    SpO2 98%    BMI 29.37 kg/m  General: Well developed, NAD, BMI noted Neck: No  thyromegaly  HEENT:  Normocephalic . Face symmetric, atraumatic Lungs:  CTA B Normal respiratory effort, no intercostal retractions, no accessory muscle use. Heart: RRR,  no murmur.  Abdomen:  Not distended, soft, non-tender. No rebound or rigidity.   Lower extremities: no pretibial edema bilaterally  Skin: Exposed areas without rash. Not pale. Not jaundice Neurologic:  alert & oriented X3.  Speech normal,  gait appropriate for age and unassisted Strength symmetric and appropriate for age.  Psych: Cognition and judgment appear intact.  Cooperative with normal attention span and concentration.  Behavior appropriate. No anxious or depressed appearing.     Assessment    Assessment  Hyperlipidemia Depression-anxiety  Per PCP  ADD - formally dx by Dr Johnnye Sima  and started meds ~ 05-2015, f/u by [facility] Attention Specialist  OSA on CPAP , Dr Annamaria Boots , rarely takes sonata HSV  Allergic rhinitis Abdominal pain: Saw GI 2016, saw general surgery 07-2017.  History of right nonobstructive nephrolithiasis per [place] 2018  PLAN: Here for CPX Hyperlipidemia: Diet controlled, 10-year cardiovascular risk 7.5%.  Checking labs Depression anxiety: Well-controlled on Wellbutrin and Lexapro.  RF as needed ADD: Follow-up elsewhere OSA: On CPAP.  Good compliance. Cialis, Sonata: Has not taken them in a  while, will call for refills if needed. RTC 1 year.   This visit occurred during the SARS-CoV-2 public health emergency.  Safety protocols were in place, including screening questions prior to the visit, additional usage of staff PPE, and extensive cleaning of exam room while observing appropriate contact time as indicated for disinfecting solutions.

## 2021-06-06 NOTE — Patient Instructions (Signed)
It was good to see you today  Please read information about advance directive (living will) below.  Your got a tetanus shot today.  Other vaccines I recommend: COVID booster Flu shot Shingrix   GO TO THE LAB : Get the blood work     [place], [place] back for a physical exam in 1 year   "Living will", "[place] of attorney": Advanced care planning  (If you already have a living will or healthcare power of attorney, please bring the copy to be scanned in your chart.)  Advance care planning is a process that supports adults in  understanding and sharing their preferences regarding future medical care.   The patient's preferences are recorded in documents called Advance Directives.    Advanced directives are completed (and can be modified at any time) while the patient is in full mental capacity.   The documentation should be available at all times to the patient, the family and the healthcare providers.  Bring in a copy to be scanned in your chart is an excellent idea and is recommended   This legal documents direct treatment decision making and/or appoint a surrogate to make the decision if the patient is not capable to do so.    Advance directives can be documented in many types of formats,  documents have names such as:  Lliving will  Durable power of attorney for healthcare (healthcare proxy or healthcare power of attorney)  Combined directives  Physician orders for life-sustaining treatment    More information at:  [email or web address]

## 2021-06-08 ENCOUNTER — Encounter: Payer: Self-pay | Admitting: Internal Medicine

## 2021-06-08 NOTE — Assessment & Plan Note (Signed)
-  Td 2012 today - shingrix d/w patient - covid vax  booster rec -  flu shot  - declined  -CCS: Previous referrals for colonoscopy and cologuard failed; talked about the issue, he again elected Cologuard. -Prostate ca screening: DRE- PSA wnl 05-2019 -Diet and exercise:   -Labs: CMP, FLP, CBC - Vaping: Still vaping, cutting down.. - ACP: Information provided

## 2021-06-08 NOTE — Assessment & Plan Note (Signed)
Here for CPX Hyperlipidemia: Diet controlled, 10-year cardiovascular risk 7.5%.  Checking labs Depression anxiety: Well-controlled on Wellbutrin and Lexapro.  RF as needed ADD: Follow-up elsewhere OSA: On CPAP.  Good compliance. Cialis, Sonata: Has not taken them in a while, will call for refills if needed. RTC 1 year.

## 2021-07-10 ENCOUNTER — Telehealth: Payer: BC Managed Care – PPO | Admitting: Nurse Practitioner

## 2021-07-10 DIAGNOSIS — M10029 Idiopathic gout, unspecified elbow: Secondary | ICD-10-CM

## 2021-07-10 MED ORDER — INDOMETHACIN 50 MG PO CAPS: 50.0000 mg | ORAL_CAPSULE | Freq: Three times a day (TID) | ORAL | 0 refills | Status: DC

## 2021-07-10 NOTE — Progress Notes (Signed)
E-Visit for Gout Symptoms  We are sorry that you are not feeling well. We are here to help!  Based on what you shared with me it looks like you have a flare of your gout.  Gout is a form of arthritis. It can cause pain and swelling in the joints. At first, it tends to affect only 1 joint - most frequently the big toe. It happens in people who have too much uric acid in the blood. Uric acid is a chemical that is produced when the body breaks down certain foods. Uric acid can form sharp needle-like crystals that build up in the joints and cause pain. Uric acid crystals can also form inside the tubes that carry urine from the kidneys to the bladder. These crystals can turn into "kidney stones" that can cause pain and problems with the flow of urine. People with gout get sudden "flares" or attacks of severe pain, most often the big toe, ankle, or knee. Often the joint also turns red and swells. Usually, only 1 joint is affected, but some people have pain in more than 1 joint. Gout flares tend to happen more often during the night.  The pain from gout can be extreme. The pain and swelling are worst at the beginning of a gout flare. The symptoms then get better within a few days to weeks. It is not clear how the body "turns off" a gout flare.  Do not start any NEW preventative medicine until the gout has cleared completely. However, If you are already on Probenecid or Allopurinol for CHRONIC gout, you may continue taking this during an active flare up  I have prescribed Indomethacin 50mg  three times daily for moderate to severe pain for no more than 7 days   HOME CARE Losing weight can help relieve gout. It's not clear that following a specific diet plan will help with gout symptoms but eating a balanced diet can help improve your overall health. It can also help you lose weight, if you are overweight. In general, a healthy diet includes plenty of fruits, vegetables, whole grains, and low-fat dairy  products (labelled low fat, skim, 2%). Avoid sugar sweetened drinks (including sodas, tea, juice and juice blends, coffee drinks and sports drinks) Limit alcohol to 1-2 drinks of beer, spirits or wine daily these can make gout flares worse. Some people with gout also have other health problems, such as heart disease, high blood pressure, kidney disease, or obesity. If you have any of these issues, it's important to work with your doctor to manage them. This can help improve your overall health and might also help with your gout.  GET HELP RIGHT AWAY IF: Your symptoms persist after you have completed your treatment plan You develop severe diarrhea You develop abnormal sensations  You develop vomiting,   You develop weakness  You develop abdominal pain  FOLLOW UP WITH YOUR PRIMARY PROVIDER IF: If your symptoms do not improve within 10 days  MAKE SURE YOU  Understand these instructions. Will watch your condition. Will get help right away if you are not doing well or get worse.  Thank you for choosing an e-visit.  Your e-visit answers were reviewed by a board certified advanced clinical practitioner to complete your personal care plan. Depending upon the condition, your plan could have included both over the counter or prescription medications.  Please review your pharmacy choice. Make sure the pharmacy is open so you can pick up prescription now. If there is a problem,  you may contact your provider through [employer] and have the prescription routed to another pharmacy.  Your safety is important to [place]. If you have drug allergies check your prescription carefully.   For the next 24 hours you can use MyChart to ask questions about today's visit, request a non-urgent call back, or ask for a work or school excuse. You will get an email in the next two days asking about your experience. I hope that your e-visit has been valuable and will speed your recovery.  5-10 minutes spent reviewing  and documenting in chart.

## 2021-07-11 ENCOUNTER — Encounter: Payer: Self-pay | Admitting: Internal Medicine

## 2021-07-11 ENCOUNTER — Other Ambulatory Visit: Payer: Self-pay

## 2021-07-11 ENCOUNTER — Ambulatory Visit (INDEPENDENT_AMBULATORY_CARE_PROVIDER_SITE_OTHER): Payer: BC Managed Care – PPO | Admitting: Internal Medicine

## 2021-07-11 VITALS — BP 108/70 | HR 61 | Ht 75.0 in | Wt 243.8 lb

## 2021-07-11 DIAGNOSIS — G4733 Obstructive sleep apnea (adult) (pediatric): Secondary | ICD-10-CM

## 2021-07-11 DIAGNOSIS — F5101 Primary insomnia: Secondary | ICD-10-CM

## 2021-07-11 MED ORDER — ESZOPICLONE 3 MG PO TABS: 3.0000 mg | ORAL_TABLET | Freq: Every day | ORAL | 1 refills | Status: DC

## 2021-07-11 NOTE — Assessment & Plan Note (Signed)
He has benefited from CPAP and has been compliant.  Needs replacement machine.  We can reassess pressure settings once this is accomplished. Plan-replacement machine auto 4-15

## 2021-07-11 NOTE — Assessment & Plan Note (Signed)
We reviewed sleep habits, use of CPAP, and previous experience with Sonata. Plan-try change to Lunesta 3 mg

## 2021-07-11 NOTE — Patient Instructions (Signed)
Order- DME [facility]- please replace old CPAP machine, auto 4-15, mask of choice, humidifier, supplies, Please install Airview/ card  Script sent to try Lunesta (eszopiclone) at bedtime for sleep  Please call as needed

## 2021-07-11 NOTE — Progress Notes (Signed)
HPI male smoker followed for OSA, complicated by ADHD/Amphetamine, Insomnia, complicated by Seasonal Allergic Rhinitis, Depression, Dyslipidemia, Gout, NPSG 04/06/14- AHI 87/ hr,  --------------------------------------------------------------------------------   03/31/2018- 56 year old male former smoker followed for OSA, insomnia, complicated by ADHD/Amphetamine CPAP auto 4-15/[facility] -----pt wearing cpap avg 6hr nightly- feels pressure & mask are okay. waking feeling well rested Sonata 10 mg, amphetamine ER 18.8 mg, Lexapro 20 mg, Wellbutrin 100 mg Body weight today 254 pounds Uses Sonata 10 mg most nights-works well. No download available at this visit. Occasionally wakes with bitemporal headache.  07/11/21- 86 yoM former smoker > Vape> quit 1 month ago, returning to re-establish for OSA, Insomnia, complicated by ADHD, Seasonal Allergic Rhinitis, Depression, Dyslipidemia, Gout,  -Sonata 10, Amphetamine ER 18.8 mg, Lexapro, Wellbutrin 100, CPAP auto 4-15/ [facility] Epworth score-4 Body weight today-243 lbs Covid vax-3 Phizer Flu vax-no -----Trouble sleeping over the last year. Still has CPAP from [facility]. States he has trouble breathing with the machine on. His machine is working mechanically okay but is at least 55 years old. Sonata stopped helping sleep and he stopped using it.  Mainly difficulty initiating sleep.  Continues amphetamine in the morning for ADHD.  Has slept better since he quit vaping.   No ENT surgery heart or lung disease to his knowledge. 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 Divorced, living alone.  ROS-see HPI   + = positive Constitutional:    weight loss, night sweats, fevers, chills, fatigue, lassitude. HEENT:    headaches, difficulty swallowing, tooth/dental problems, sore throat,       sneezing, itching, ear ache, nasal congestion, post nasal drip, snoring CV:    chest pain, orthopnea, PND, swelling in lower  extremities, anasarca,                                                            dizziness, palpitations Resp:   shortness of breath with exertion or at rest.                productive cough,   non-productive cough, coughing up of blood.              change in color of mucus.  wheezing.   Skin:    rash or lesions. GI:  No-   heartburn, indigestion, abdominal pain, nausea, vomiting,  GU:  MS:   joint pain, stiffness,  Neuro-     nothing unusual Psych:  change in mood or affect.  depression or anxiety.   memory loss.  OBJ- Physical Exam General- Alert, Oriented, Affect-appropriate, [place]- none acute, big man Skin- rash-none, lesions- none, excoriation- none Lymphadenopathy- none Head- atraumatic            Eyes- Gross vision intact, PERRLA, conjunctivae and secretions clear            Ears- Hearing, canals-normal            Nose- Clear, no-Septal dev, mucus, polyps, erosion, perforation             Throat- Mallampati III-IV , mucosa +red, drainage- none, tonsils- atrophic, + teeth Neck- flexible , trachea midline, no stridor , thyroid nl, carotid no bruit Chest - symmetrical excursion , unlabored           Heart/CV- RRR ,  no murmur , no gallop  , no rub, nl s1 s2                           - JVD- none , edema- none, stasis changes- none, varices- none           Lung- clear to P&A, wheeze- none, cough- none , dullness-none, rub- none           Chest wall-  Abd-  Br/ Gen/ Rectal- Not done, not indicated Extrem- cyanosis- none, clubbing, none, atrophy- none, strength- nl Neuro- grossly intact to observation

## 2021-07-30 ENCOUNTER — Other Ambulatory Visit: Payer: Self-pay

## 2021-08-11 ENCOUNTER — Telehealth: Payer: Self-pay | Admitting: Internal Medicine

## 2021-08-11 DIAGNOSIS — G4733 Obstructive sleep apnea (adult) (pediatric): Secondary | ICD-10-CM

## 2021-08-11 NOTE — Telephone Encounter (Signed)
An order was already placed with [facility] to replace his old machine. ?Please advise [facility] that he is requesting the replacement be an AirSense 11 autoset machine auto 4-15, mask of choice, humidifier, supplies, and install AirView/ card. ?

## 2021-08-11 NOTE — Telephone Encounter (Signed)
Order placed to [facility] [facility].  ?Patient is aware and voiced his understanding.  ?Nothing further needed.  ? ?

## 2021-08-11 NOTE — Telephone Encounter (Signed)
Spoke to patient.  ?He is requesting order to be sent to [place] resmed airsense 11. ? ?Dr. Annamaria Boots, please advise. Thanks ?

## 2021-08-19 ENCOUNTER — Encounter: Payer: Self-pay | Admitting: Internal Medicine

## 2021-10-16 ENCOUNTER — Encounter: Payer: Self-pay | Admitting: *Deleted

## 2021-10-19 ENCOUNTER — Other Ambulatory Visit: Payer: Self-pay | Admitting: Internal Medicine

## 2021-10-20 NOTE — Telephone Encounter (Signed)
Eszopiclone refilled 

## 2021-10-20 NOTE — Telephone Encounter (Signed)
Please advise on med refill.  No Known Allergies  Current Outpatient Medications:    Amphetamine ER 18.8 MG TBED, Take 1 tablet by mouth daily., Disp: , Rfl:    buPROPion (WELLBUTRIN) 100 MG tablet, TAKE 1 TABLET DAILY, Disp: 90 tablet, Rfl: 3   escitalopram (LEXAPRO) 20 MG tablet, TAKE 1 TABLET DAILY, Disp: 90 tablet, Rfl: 3   Eszopiclone 3 MG TABS, Take 1 tablet (3 mg total) by mouth at bedtime. Take immediately before bedtime, Disp: 30 tablet, Rfl: 1   indomethacin (INDOCIN) 50 MG capsule, Take 1 capsule (50 mg total) by mouth 3 (three) times daily with meals., Disp: 21 capsule, Rfl: 0   tadalafil (CIALIS) 20 MG tablet, TAKE ONE-HALF (1/2) TO ONE TABLET EVERY OTHER DAY AS NEEDED FOR ERECTILE DYSFUNCTION, Disp: 24 tablet, Rfl: 3   valACYclovir (VALTREX) 500 MG tablet, Take 1 tablet (500 mg total) by mouth 2 (two) times daily., Disp: 30 tablet, Rfl: 1

## 2021-11-10 ENCOUNTER — Ambulatory Visit (INDEPENDENT_AMBULATORY_CARE_PROVIDER_SITE_OTHER): Payer: BC Managed Care – PPO | Admitting: Internal Medicine

## 2021-11-10 ENCOUNTER — Encounter: Payer: Self-pay | Admitting: Internal Medicine

## 2021-11-10 VITALS — BP 126/66 | HR 64 | Temp 98.9°F | Ht 75.0 in | Wt 248.6 lb

## 2021-11-10 DIAGNOSIS — G4733 Obstructive sleep apnea (adult) (pediatric): Secondary | ICD-10-CM

## 2021-11-10 DIAGNOSIS — F5101 Primary insomnia: Secondary | ICD-10-CM

## 2021-11-10 NOTE — Assessment & Plan Note (Signed)
Benefits from CPAP.  We have contacted DME for download.  He is getting frustrated with poor mask fit. Plan-refer for mask fitting

## 2021-12-30 ENCOUNTER — Ambulatory Visit (HOSPITAL_BASED_OUTPATIENT_CLINIC_OR_DEPARTMENT_OTHER): Payer: BC Managed Care – PPO | Attending: Internal Medicine | Admitting: Internal Medicine

## 2021-12-30 DIAGNOSIS — G4733 Obstructive sleep apnea (adult) (pediatric): Secondary | ICD-10-CM

## 2022-01-09 ENCOUNTER — Encounter: Payer: Self-pay | Admitting: Internal Medicine

## 2022-01-26 ENCOUNTER — Other Ambulatory Visit: Payer: Self-pay | Admitting: Internal Medicine

## 2022-03-07 DIAGNOSIS — G4733 Obstructive sleep apnea (adult) (pediatric): Secondary | ICD-10-CM | POA: Diagnosis not present

## 2022-03-25 DIAGNOSIS — F902 Attention-deficit hyperactivity disorder, combined type: Secondary | ICD-10-CM | POA: Diagnosis not present

## 2022-03-25 DIAGNOSIS — Z79899 Other long term (current) drug therapy: Secondary | ICD-10-CM | POA: Diagnosis not present

## 2022-03-26 DIAGNOSIS — Z79899 Other long term (current) drug therapy: Secondary | ICD-10-CM | POA: Diagnosis not present

## 2022-04-07 DIAGNOSIS — G4733 Obstructive sleep apnea (adult) (pediatric): Secondary | ICD-10-CM | POA: Diagnosis not present

## 2022-06-07 DIAGNOSIS — G4733 Obstructive sleep apnea (adult) (pediatric): Secondary | ICD-10-CM | POA: Diagnosis not present

## 2022-06-12 ENCOUNTER — Ambulatory Visit (INDEPENDENT_AMBULATORY_CARE_PROVIDER_SITE_OTHER): Payer: BC Managed Care – PPO | Admitting: Internal Medicine

## 2022-06-12 ENCOUNTER — Encounter: Payer: Self-pay | Admitting: Internal Medicine

## 2022-06-12 VITALS — BP 126/68 | HR 57 | Temp 98.0°F | Resp 18 | Ht 75.0 in | Wt 251.0 lb

## 2022-06-12 DIAGNOSIS — Z0000 Encounter for general adult medical examination without abnormal findings: Secondary | ICD-10-CM | POA: Diagnosis not present

## 2022-06-12 DIAGNOSIS — E785 Hyperlipidemia, unspecified: Secondary | ICD-10-CM

## 2022-06-12 LAB — LIPID PANEL
Cholesterol: 223 mg/dL — ABNORMAL HIGH (ref 0–200)
HDL: 34.8 mg/dL — ABNORMAL LOW (ref >39.00)
NonHDL: 188.35
Total CHOL/HDL Ratio: 6
Triglycerides: 246 mg/dL — ABNORMAL HIGH (ref 0.0–149.0)
VLDL: 49.2 mg/dL — ABNORMAL HIGH (ref 0.0–40.0)

## 2022-06-12 LAB — COMPREHENSIVE METABOLIC PANEL
ALT: 54 U/L — ABNORMAL HIGH (ref 0–53)
AST: 33 U/L (ref 0–37)
Albumin: 4.4 g/dL (ref 3.5–5.2)
Alkaline Phosphatase: 70 U/L (ref 39–117)
BUN: 13 mg/dL (ref 6–23)
CO2: 29 mEq/L (ref 19–32)
Calcium: 9.1 mg/dL (ref 8.4–10.5)
Chloride: 104 mEq/L (ref 96–112)
Creatinine, Ser: 1.14 mg/dL (ref 0.40–1.50)
GFR: 72.62 mL/min (ref >60.00)
Glucose, Bld: 102 mg/dL — ABNORMAL HIGH (ref 70–99)
Potassium: 4.1 mEq/L (ref 3.5–5.1)
Sodium: 143 mEq/L (ref 135–145)
Total Bilirubin: 0.4 mg/dL (ref 0.2–1.2)
Total Protein: 6.8 g/dL (ref 6.0–8.3)

## 2022-06-12 LAB — CBC WITH DIFFERENTIAL/PLATELET
Basophils Absolute: 0.1 10*3/uL (ref 0.0–0.1)
Basophils Relative: 1.1 % (ref 0.0–3.0)
Eosinophils Absolute: 0.2 10*3/uL (ref 0.0–0.7)
Eosinophils Relative: 2.8 % (ref 0.0–5.0)
HCT: 45.2 % (ref 39.0–52.0)
Hemoglobin: 15.4 g/dL (ref 13.0–17.0)
Lymphocytes Relative: 30.8 % (ref 12.0–46.0)
Lymphs Abs: 1.9 10*3/uL (ref 0.7–4.0)
MCHC: 34.2 g/dL (ref 30.0–36.0)
MCV: 87.9 fl (ref 78.0–100.0)
Monocytes Absolute: 0.6 10*3/uL (ref 0.1–1.0)
Monocytes Relative: 9.1 % (ref 3.0–12.0)
Neutro Abs: 3.5 10*3/uL (ref 1.4–7.7)
Neutrophils Relative %: 56.2 % (ref 43.0–77.0)
Platelets: 244 10*3/uL (ref 150.0–400.0)
RBC: 5.15 Mil/uL (ref 4.22–5.81)
RDW: 13.4 % (ref 11.5–15.5)
WBC: 6.3 10*3/uL (ref 4.0–10.5)

## 2022-06-12 LAB — TSH: TSH: 1.77 u[IU]/mL (ref 0.35–5.50)

## 2022-06-12 LAB — LDL CHOLESTEROL, DIRECT: Direct LDL: 124 mg/dL

## 2022-06-12 LAB — PSA: PSA: 0.24 ng/mL (ref 0.10–4.00)

## 2022-06-12 NOTE — Patient Instructions (Addendum)
Recommend to start exercise program.  Taking a walk 3-4 times a week would be a good to start  Watch your diet closely  GO TO THE LAB : Get the blood work     [place], [place] back for   a physical exam in 1 year    "[place] of attorney" ,  "Living will" (Advance care planning documents)  If you already have a living will or healthcare power of attorney, is recommended you bring the copy to be scanned in your chart.   The document will be available to all the doctors you see in the system.  Advance care planning is a process that supports adults in  understanding and sharing their preferences regarding future medical care.  The patient's preferences are recorded in documents called Advance Directives and the can be modified at any time while the patient is in full mental capacity.   If you don't have one, please consider create one.      More information at: [email or web address]

## 2022-06-12 NOTE — Assessment & Plan Note (Signed)
-  Td 2012: 2023 - shingrix, COVID and flu shot recommended. Benefits> risks.declined -CCS: Previous referrals for colonoscopy failed.  He just sent a Cologuard a few days ago.  Results pending. -Prostate ca screening: No FH, no symptoms, check PSA -Following a regular diet, encouraged to decrease carbohydrates and increase lean meats.  Also increase fruits and vegetables.  Encourage a regular walking program. -Labs: CMP FLP CBC TSH PSA - Vaping: Completely quit about 6 months ago. Praised . -Healthcare POA: See AVS

## 2022-06-12 NOTE — Progress Notes (Signed)
Subjective:    Patient ID: Larry Yu, male    DOB: January 06, 1966, 57 y.o.   MRN: [id number]  DOS:  06/12/2022 Type of visit - description: cpx  Here for CPX.  Doing well.  Has no concerns.  Review of Systems   A 14 point review of systems is negative    Past Medical History:  Diagnosis Date   ADHD (attention deficit hyperactivity disorder)    see's Dr. Johnnye Sima   Allergic rhinitis    Depression 12/21/2012   Hyperlipidemia    OSA (obstructive sleep apnea) 05-2013    Past Surgical History:  Procedure Laterality Date   NO PAST SURGERIES     Social History   Socioeconomic History   Marital status: Divorced    Spouse name: Not on file   Number of children: 2   Years of education: Not on file   Highest education level: Not on file  Occupational History   Occupation: Training and development officer: [employer]  Tobacco Use   Smoking status: Former    Packs/day: 1.00    Years: 13.00    Total pack years: 13.00    Types: Cigarettes, Cigars    Quit date: 05/18/1993    Years since quitting: 29.0   Smokeless tobacco: Never   Tobacco comments:    Not vaping as off 05-2022       Vaping Use   Vaping Use: Former   Quit date: 06/10/2021  Substance and Sexual Activity   Alcohol use: Not Currently   Drug use: No    Comment: QUIT 1995   Sexual activity: Not on file  Other Topics Concern   Not on file  Social History Narrative   Divorced, remarried 2013, divorced again, lives by himself     2 children: ~ 1998, 2004 they live in [place] Determinants of Health   Financial Resource Strain: Not on file  Food Insecurity: Not on file  Transportation Needs: Not on file  Physical Activity: Not on file  Stress: Not on file  Social Connections: Not on file  Intimate Partner Violence: Not on file    Current Outpatient Medications  Medication Instructions   Amphetamine ER 18.8 MG TBED 1 tablet, Oral, Daily   buPROPion (WELLBUTRIN) 100 mg, Oral, Daily   escitalopram (LEXAPRO) 20  mg, Oral, Daily   indomethacin (INDOCIN) 50 mg, Oral, 3 times daily with meals   tadalafil (CIALIS) 20 MG tablet TAKE ONE-HALF (1/2) TO ONE TABLET EVERY OTHER DAY AS NEEDED FOR ERECTILE DYSFUNCTION   valACYclovir (VALTREX) 500 mg, Oral, 2 times daily       Objective:   Physical Exam BP 126/68   Pulse (!) 57   Temp 98 F (36.7 C) (Oral)   Resp 18   Ht '6\' 3"'$  (1.905 m)   Wt 251 lb (113.9 kg)   SpO2 97%   BMI 31.37 kg/m  General: Well developed, NAD, BMI noted Neck: No  thyromegaly  HEENT:  Normocephalic . Face symmetric, atraumatic Lungs:  CTA B Normal respiratory effort, no intercostal retractions, no accessory muscle use. Heart: RRR,  no murmur.  Abdomen:  Not distended, soft, non-tender. No rebound or rigidity.   Lower extremities: no pretibial edema bilaterally  Skin: Exposed areas without rash. Not pale. Not jaundice Neurologic:  alert & oriented X3.  Speech normal, gait appropriate for age and unassisted Strength symmetric and appropriate for age.  Psych: Cognition and judgment appear intact.  Cooperative with normal attention span and  concentration.  Behavior appropriate. No anxious or depressed appearing.     Assessment     Assessment  Hyperlipidemia Depression-anxiety  Per PCP  ADD - formally dx by Dr Johnnye Sima  and started meds ~ 05-2015, f/u by [facility] Attention Specialist  OSA on CPAP , Dr Annamaria Boots , rarely takes sonata HSV  Allergic rhinitis Abdominal pain: Saw GI 2016, saw general surgery 07-2017.  History of right nonobstructive nephrolithiasis per [place] 2018  PLAN: Here for CPX Hyperlipidemia: Diet controlled, checking labs. Depression anxiety: Controlled with current meds ADD: Follow-up elsewhere OSA: Just got a new CPAP, working well for him, does not need sleeping aids other than occasional melatonin. HSV: Rarely take Valtrex RTC next year

## 2022-06-12 NOTE — Assessment & Plan Note (Signed)
Here for CPX Hyperlipidemia: Diet controlled, checking labs. Depression anxiety: Controlled with current meds ADD: Follow-up elsewhere OSA: Just got a new CPAP, working well for him, does not need sleeping aids other than occasional melatonin. HSV: Rarely take Valtrex RTC next year

## 2022-06-25 DIAGNOSIS — F902 Attention-deficit hyperactivity disorder, combined type: Secondary | ICD-10-CM | POA: Diagnosis not present

## 2022-06-25 DIAGNOSIS — Z79899 Other long term (current) drug therapy: Secondary | ICD-10-CM | POA: Diagnosis not present

## 2022-07-08 DIAGNOSIS — G4733 Obstructive sleep apnea (adult) (pediatric): Secondary | ICD-10-CM | POA: Diagnosis not present

## 2022-07-24 ENCOUNTER — Other Ambulatory Visit: Payer: Self-pay | Admitting: Internal Medicine

## 2022-09-24 DIAGNOSIS — F902 Attention-deficit hyperactivity disorder, combined type: Secondary | ICD-10-CM | POA: Diagnosis not present

## 2022-09-24 DIAGNOSIS — Z79899 Other long term (current) drug therapy: Secondary | ICD-10-CM | POA: Diagnosis not present

## 2022-09-25 DIAGNOSIS — F902 Attention-deficit hyperactivity disorder, combined type: Secondary | ICD-10-CM | POA: Diagnosis not present

## 2022-11-11 NOTE — Progress Notes (Signed)
HPI male smoker followed for OSA, complicated by ADHD/Amphetamine, Insomnia, complicated by Seasonal Allergic Rhinitis, Depression, Dyslipidemia, Gout, NPSG 04/06/14- AHI 87/ hr,  --------------------------------------------------------------------------------   11/10/21- 55 yoM former smoker > Vape> quit , followed for OSA, Insomnia, complicated by ADHD, Seasonal Allergic Rhinitis, Depression, Dyslipidemia, Gout,  -Lunesta 3, Amphetamine ER 18.8 mg, Lexapro, Wellbutrin 100, CPAP auto 4-15/ [facility]PS Download compliance- Body weight today-248 bs Covid vax-3 Phizer He reports using CPAP every night unless he falls asleep first.  Download not available yet.  Mask not fitting well and we discussed sending for mask fitting. He did not like Lunesta-at least partly bad taste-and he is using melatonin 5 mg which seems to work okay.  11/12/22- 56 yoM former smoker > Vape> quit , followed for OSA, Insomnia, complicated by ADHD, Seasonal Allergic Rhinitis, Depression, Dyslipidemia, Gout,  -Melatonin 5mg ,  Amphetamine ER 18.8 mg, Lexapro, Wellbutrin 100, CPAP auto 4-15/ APS/[employer] AirSense 11 Download compliance- not available Body weight today-247 lbs Nasal pillows mask Little need for melatonin, but it works for him if he does need it.  ROS-see HPI   + = positive Constitutional:    weight loss, night sweats, fevers, chills, fatigue, lassitude. HEENT:    headaches, difficulty swallowing, tooth/dental problems, sore throat,       sneezing, itching, ear ache, nasal congestion, post nasal drip, snoring CV:    chest pain, orthopnea, PND, swelling in lower extremities, anasarca,                                                            dizziness, palpitations Resp:   shortness of breath with exertion or at rest.                productive cough,   non-productive cough, coughing up of blood.              change in color of mucus.  wheezing.   Skin:    rash or lesions. GI:  No-   heartburn, indigestion,  abdominal pain, nausea, vomiting,  GU:  MS:   joint pain, stiffness,  Neuro-     nothing unusual Psych:  change in mood or affect.  depression or anxiety.   memory loss.  OBJ- Physical Exam General- Alert, Oriented, Affect-appropriate, Diistress- none acute, big man Skin- rash-none, lesions- none, excoriation- none Lymphadenopathy- none Head- atraumatic            Eyes- Gross vision intact, PERRLA, conjunctivae and secretions clear            Ears- Hearing, canals-normal            Nose- Clear, no-Septal dev, mucus, polyps, erosion, perforation             Throat- Mallampati III-IV , mucosa , drainage- none, tonsils- atrophic, + teeth Neck- flexible , trachea midline, no stridor , thyroid nl, carotid no bruit Chest - symmetrical excursion , unlabored           Heart/CV- RRR , no murmur , no gallop  , no rub, nl s1 s2                           - JVD- none , edema- none, stasis changes- none, varices- none  Lung- clear to P&A, wheeze- none, cough- none , dullness-none, rub- none           Chest wall-  Abd-  Br/ Gen/ Rectal- Not done, not indicated Extrem- cyanosis- none, clubbing, none, atrophy- none, strength- nl Neuro- grossly intact to observation

## 2022-11-12 ENCOUNTER — Ambulatory Visit (INDEPENDENT_AMBULATORY_CARE_PROVIDER_SITE_OTHER): Payer: BC Managed Care – PPO | Admitting: Internal Medicine

## 2022-11-12 ENCOUNTER — Encounter: Payer: Self-pay | Admitting: Internal Medicine

## 2022-11-12 VITALS — BP 112/76 | HR 99 | Ht 75.0 in | Wt 247.0 lb

## 2022-11-12 DIAGNOSIS — F5101 Primary insomnia: Secondary | ICD-10-CM

## 2022-11-12 DIAGNOSIS — G4733 Obstructive sleep apnea (adult) (pediatric): Secondary | ICD-10-CM | POA: Diagnosis not present

## 2022-11-12 NOTE — Patient Instructions (Signed)
Order- DME Patsy Lager- please provide download capability, AirView or SD card. Continue auto 4-15.  Please call if we can help

## 2022-12-25 ENCOUNTER — Encounter: Payer: Self-pay | Admitting: Internal Medicine

## 2022-12-25 NOTE — Assessment & Plan Note (Signed)
He states melatonin is adequate if needed.

## 2022-12-25 NOTE — Assessment & Plan Note (Signed)
Reports comfortable benefit from CPAP Plan- continue auto 4-15. [employer] to provide SD card

## 2023-01-22 DIAGNOSIS — F902 Attention-deficit hyperactivity disorder, combined type: Secondary | ICD-10-CM | POA: Diagnosis not present

## 2023-01-22 DIAGNOSIS — Z79899 Other long term (current) drug therapy: Secondary | ICD-10-CM | POA: Diagnosis not present

## 2023-02-19 ENCOUNTER — Other Ambulatory Visit: Payer: Self-pay | Admitting: Internal Medicine

## 2023-02-19 DIAGNOSIS — Z1211 Encounter for screening for malignant neoplasm of colon: Secondary | ICD-10-CM

## 2023-02-19 DIAGNOSIS — Z1212 Encounter for screening for malignant neoplasm of rectum: Secondary | ICD-10-CM

## 2023-04-22 DIAGNOSIS — Z79899 Other long term (current) drug therapy: Secondary | ICD-10-CM | POA: Diagnosis not present

## 2023-04-22 DIAGNOSIS — F902 Attention-deficit hyperactivity disorder, combined type: Secondary | ICD-10-CM | POA: Diagnosis not present

## 2023-05-04 DIAGNOSIS — F902 Attention-deficit hyperactivity disorder, combined type: Secondary | ICD-10-CM | POA: Diagnosis not present

## 2023-06-10 ENCOUNTER — Encounter: Payer: Self-pay | Admitting: Internal Medicine

## 2023-06-16 ENCOUNTER — Encounter: Payer: Self-pay | Admitting: Internal Medicine

## 2023-06-16 ENCOUNTER — Ambulatory Visit: Payer: BC Managed Care – PPO | Admitting: Internal Medicine

## 2023-06-16 VITALS — BP 116/66 | HR 62 | Temp 97.8°F | Resp 16 | Ht 75.0 in | Wt 233.4 lb

## 2023-06-16 DIAGNOSIS — E785 Hyperlipidemia, unspecified: Secondary | ICD-10-CM

## 2023-06-16 DIAGNOSIS — Z0000 Encounter for general adult medical examination without abnormal findings: Secondary | ICD-10-CM | POA: Diagnosis not present

## 2023-06-16 DIAGNOSIS — R197 Diarrhea, unspecified: Secondary | ICD-10-CM

## 2023-06-16 DIAGNOSIS — Z1211 Encounter for screening for malignant neoplasm of colon: Secondary | ICD-10-CM

## 2023-06-16 LAB — LIPID PANEL
Cholesterol: 188 mg/dL (ref 0–200)
HDL: 34.2 mg/dL — ABNORMAL LOW (ref >39.00)
LDL Cholesterol: 119 mg/dL — ABNORMAL HIGH (ref 0–99)
NonHDL: 153.38
Total CHOL/HDL Ratio: 5
Triglycerides: 172 mg/dL — ABNORMAL HIGH (ref 0.0–149.0)
VLDL: 34.4 mg/dL (ref 0.0–40.0)

## 2023-06-16 LAB — CBC WITH DIFFERENTIAL/PLATELET
Basophils Absolute: 0 10*3/uL (ref 0.0–0.1)
Basophils Relative: 0.6 % (ref 0.0–3.0)
Eosinophils Absolute: 0.1 10*3/uL (ref 0.0–0.7)
Eosinophils Relative: 1.8 % (ref 0.0–5.0)
HCT: 45.1 % (ref 39.0–52.0)
Hemoglobin: 15 g/dL (ref 13.0–17.0)
Lymphocytes Relative: 22.8 % (ref 12.0–46.0)
Lymphs Abs: 1.8 10*3/uL (ref 0.7–4.0)
MCHC: 33.2 g/dL (ref 30.0–36.0)
MCV: 87.6 fL (ref 78.0–100.0)
Monocytes Absolute: 0.6 10*3/uL (ref 0.1–1.0)
Monocytes Relative: 7.3 % (ref 3.0–12.0)
Neutro Abs: 5.3 10*3/uL (ref 1.4–7.7)
Neutrophils Relative %: 67.5 % (ref 43.0–77.0)
Platelets: 347 10*3/uL (ref 150.0–400.0)
RBC: 5.14 Mil/uL (ref 4.22–5.81)
RDW: 13.6 % (ref 11.5–15.5)
WBC: 7.9 10*3/uL (ref 4.0–10.5)

## 2023-06-16 LAB — COMPREHENSIVE METABOLIC PANEL
ALT: 32 U/L (ref 0–53)
AST: 18 U/L (ref 0–37)
Albumin: 4.3 g/dL (ref 3.5–5.2)
Alkaline Phosphatase: 71 U/L (ref 39–117)
BUN: 19 mg/dL (ref 6–23)
CO2: 30 meq/L (ref 19–32)
Calcium: 9.5 mg/dL (ref 8.4–10.5)
Chloride: 104 meq/L (ref 96–112)
Creatinine, Ser: 1.17 mg/dL (ref 0.40–1.50)
GFR: 69.89 mL/min (ref >60.00)
Glucose, Bld: 99 mg/dL (ref 70–99)
Potassium: 5 meq/L (ref 3.5–5.1)
Sodium: 140 meq/L (ref 135–145)
Total Bilirubin: 0.4 mg/dL (ref 0.2–1.2)
Total Protein: 6.7 g/dL (ref 6.0–8.3)

## 2023-06-16 LAB — PSA: PSA: 0.38 ng/mL (ref 0.10–4.00)

## 2023-06-16 NOTE — Addendum Note (Signed)
Addended byConrad [place]arks[place] D on: 06/16/2023 09:16 AM   Modules accepted: Orders

## 2023-06-16 NOTE — Progress Notes (Signed)
Subjective:    Patient ID: Larry Yu, male    DOB: 04-Oct-1965, 58 y.o.   MRN: [id number]  DOS:  06/16/2023 Type of visit - description: CPX  Here for CPX. Patient self  weaned off Wellbutrin Lexapro, he felt he could do without it , so far is doing well. Today he reports chronic loose stools, denies fever or chills.  No weight loss.  No blood in the stools.     Request GI referral.   Review of Systems  Other than above, a 14 point review of systems is negative    Past Medical History:  Diagnosis Date   ADHD (attention deficit hyperactivity disorder)    see's Dr. Elisabeth Most   Allergic rhinitis    Depression 12/21/2012   Hyperlipidemia    OSA (obstructive sleep apnea) 05-2013    Past Surgical History:  Procedure Laterality Date   NO PAST SURGERIES     Social History   Socioeconomic History   Marital status: Divorced    Spouse name: Not on file   Number of children: 2   Years of education: Not on file   Highest education level: Not on file  Occupational History   Occupation: English as a second language teacher: [employer]  Tobacco Use   Smoking status: Former    Current packs/day: 0.00    Average packs/day: 1 pack/day for 13.0 years (13.0 ttl pk-yrs)    Types: Cigarettes, Cigars    Start date: 05/18/1980    Quit date: 05/18/1993    Years since quitting: 30.1   Smokeless tobacco: Never   Tobacco comments:    Not vaping as off 05-2023       Vaping Use   Vaping status: Former   Quit date: 06/10/2021  Substance and Sexual Activity   Alcohol use: Not Currently   Drug use: No    Comment: QUIT 1995   Sexual activity: Not on file  Other Topics Concern   Not on file  Social History Narrative   Divorced, remarried 2013, divorced again, lives by himself     2 children: ~ 1998, 2004 they live in [place]   Social Drivers of Corporate investment banker Strain: Not on file  Food Insecurity: Not on file  Transportation Needs: Not on file  Physical Activity: Not on file  Stress: Not on  file  Social Connections: Not on file  Intimate Partner Violence: Not on file    Current Outpatient Medications  Medication Instructions   Amphetamine ER 18.8 MG TBED 1 tablet, Daily   buPROPion (WELLBUTRIN) 100 mg, Oral, Daily   escitalopram (LEXAPRO) 20 mg, Oral, Daily   indomethacin (INDOCIN) 50 mg, Oral, 3 times daily with meals   tadalafil (CIALIS) 20 MG tablet TAKE ONE-HALF (1/2) TO ONE TABLET EVERY OTHER DAY AS NEEDED FOR ERECTILE DYSFUNCTION   valACYclovir (VALTREX) 500 mg, Oral, 2 times daily       Objective:   Physical Exam BP 116/66   Pulse 62   Temp 97.8 F (36.6 C) (Oral)   Resp 16   Ht 6\' 3"  (1.905 m)   Wt 233 lb 6 oz (105.9 kg)   SpO2 97%   BMI 29.17 kg/m  General: Well developed, NAD, BMI noted Neck: No  thyromegaly  HEENT:  Normocephalic . Face symmetric, atraumatic Lungs:  CTA B Normal respiratory effort, no intercostal retractions, no accessory muscle use. Heart: RRR,  no murmur.  Abdomen:  Not distended, soft, non-tender. No rebound or rigidity.  Lower extremities: no pretibial edema bilaterally  Skin: Exposed areas without rash. Not pale. Not jaundice Neurologic:  alert & oriented X3.  Speech normal, gait appropriate for age and unassisted Strength symmetric and appropriate for age.  Psych: Cognition and judgment appear intact.  Cooperative with normal attention span and concentration.  Behavior appropriate. No anxious or depressed appearing.     Assessment    Problem list Hyperlipidemia Depression-anxiety  Per PCP  ADD - formally dx by Dr Elisabeth Most  and started meds ~ 05-2015, f/u by [facility] Attention Specialist  OSA on CPAP , Dr Maple Hudson , rarely takes sonata HSV  Allergic rhinitis Abdominal pain: Saw GI 2016, saw general surgery 07-2017.  History of right nonobstructive nephrolithiasis per [place] 2018  PLAN: Here for CPX -Td 2012: 2023 -Vaccine advised:  shingrix, COVID and flu shot recommended. Benefits d/w pt  -CCS: Previous CCS  recommendations  failed.    Today he reports chronic diarrhea, likes to see GI.  Referral placed. -Prostate ca screening: No FH, no symptoms, check PSA - Diet improved, eating more vegetables, less carbohydrates, more proteins.  Has not started a exercise program, recommend to do. -Labs: CMP FLP CBC PSA  -Healthcare POA: See AVS  We also talk about other issues: Hyperlipidemia: I reviewed  w/ the  patient previous FLP's and cardiovascular risk, risk  is gradually increasing.  Statins are an option.  He will be somewhat hesitant.  Check FLP. Depression, anxiety: Self wean off Wellbutrin and Lexapro, he feels he can do without them, so far feels good.  Plan: Patient will come back if he feels the need to restart medication. Diarrhea: See comments under CPX, refer to GI RTC 1 year.

## 2023-06-16 NOTE — Patient Instructions (Addendum)
We are referring you to the gastroenterologist to discuss your stomach problems and a colonoscopy. Please call them: [phone].  Vaccines I recommend: Covid booster Shingrix (shingles) Flu shot   GO TO THE LAB : Get the blood work     Next visit with me in 1 year for a physical exam.  Call sooner if needed. Please schedule it at the front desk        "Health Care Power of attorney" ,  "Living will" (Advance care planning documents)  If you already have a living will or healthcare power of attorney, is recommended you bring the copy to be scanned in your chart.   The document will be available to all the doctors you see in the system.  Advance care planning is a process that supports adults in  understanding and sharing their preferences regarding future medical care.  The patient's preferences are recorded in documents called Advance Directives and the can be modified at any time while the patient is in full mental capacity.   If you don't have one, please consider create one.      More information at: [email or web address]

## 2023-06-18 ENCOUNTER — Encounter: Payer: Self-pay | Admitting: Internal Medicine

## 2023-06-18 NOTE — Assessment & Plan Note (Signed)
Here for CPX -Td 2012: 2023 -Vaccine advised:  shingrix, COVID and flu shot recommended. Benefits d/w pt  -CCS: Previous CCS recommendations  failed.    Today he reports chronic diarrhea, likes to see GI.  Referral placed. -Prostate ca screening: No FH, no symptoms, check PSA - Diet improved, eating more vegetables, less carbohydrates, more proteins.  Has not started a exercise program, recommend to do. -Labs: CMP FLP CBC PSA  -Healthcare POA: See AVS

## 2023-06-18 NOTE — Assessment & Plan Note (Addendum)
Here for CPX We also talk about other issues: Hyperlipidemia: I reviewed  w/ the  patient previous FLP's and cardiovascular risk, risk  is gradually increasing.  Statins are an option.  He will be somewhat hesitant.  Check FLP. Depression, anxiety: Self wean off Wellbutrin and Lexapro, he feels he can do without them, so far feels good.  Plan: Patient will come back if he feels the need to restart medication. Diarrhea: See comments under CPX, refer to GI RTC 1 year.

## 2023-07-27 ENCOUNTER — Encounter: Payer: Self-pay | Admitting: Gastroenterology

## 2023-08-18 DIAGNOSIS — Z79899 Other long term (current) drug therapy: Secondary | ICD-10-CM | POA: Diagnosis not present

## 2023-08-18 DIAGNOSIS — F902 Attention-deficit hyperactivity disorder, combined type: Secondary | ICD-10-CM | POA: Diagnosis not present

## 2023-09-12 NOTE — Progress Notes (Unsigned)
 Chief Complaint: Primary GI MD: Dr. Elvin Hammer  HPI: Discussed the use of AI scribe software for clinical note transcription with the patient, who gave verbal consent to proceed.  History of Present Illness      PREVIOUS GI WORKUP    US  abdomen complete for RUQ pain 2018 showed fatty liver, adenomyomatosis of gallbladder with no visible stones, and nephrolithiasis  Past Medical History:  Diagnosis Date   ADHD (attention deficit hyperactivity disorder)    see's Dr. Adell Age   Allergic rhinitis    Depression 12/21/2012   Hyperlipidemia    OSA (obstructive sleep apnea) 05-2013    Past Surgical History:  Procedure Laterality Date   NO PAST SURGERIES      Current Outpatient Medications  Medication Sig Dispense Refill   Amphetamine ER 18.8 MG TBED Take 1 tablet by mouth daily.     indomethacin  (INDOCIN ) 50 MG capsule Take 1 capsule (50 mg total) by mouth 3 (three) times daily with meals. 21 capsule 0   tadalafil  (CIALIS ) 20 MG tablet TAKE ONE-HALF (1/2) TO ONE TABLET EVERY OTHER DAY AS NEEDED FOR ERECTILE DYSFUNCTION 24 tablet 3   valACYclovir  (VALTREX ) 500 MG tablet Take 1 tablet (500 mg total) by mouth 2 (two) times daily. 30 tablet 1   No current facility-administered medications for this visit.    Allergies as of 09/13/2023   (No Known Allergies)    Family History  Problem Relation Age of Onset   Hypertension Mother    Dementia Mother        dx age 33   CAD Neg Hx    Diabetes Neg Hx    Colon cancer Neg Hx    Prostate cancer Neg Hx    Stroke Neg Hx     Social History   Socioeconomic History   Marital status: Divorced    Spouse name: Not on file   Number of children: 2   Years of education: Not on file   Highest education level: Not on file  Occupational History   Occupation: English as a second language teacher: [employer]  Tobacco Use   Smoking status: Former    Current packs/day: 0.00    Average packs/day: 1 pack/day for 13.0 years (13.0 ttl pk-yrs)    Types:  Cigarettes, Cigars    Start date: 05/18/1980    Quit date: 05/18/1993    Years since quitting: 30.3   Smokeless tobacco: Never   Tobacco comments:    Not vaping as off 05-2023       Vaping Use   Vaping status: Former   Quit date: 06/10/2021  Substance and Sexual Activity   Alcohol use: Not Currently   Drug use: No    Comment: QUIT 1995   Sexual activity: Not on file  Other Topics Concern   Not on file  Social History Narrative   Divorced, remarried 2013, divorced again, lives by himself     2 children: ~ 1998, 2004 they live in [place]   Social Drivers of Corporate investment banker Strain: Not on [employer] Insecurity: Not on file  Transportation Needs: Not on file  Physical Activity: Not on file  Stress: Not on file  Social Connections: Not on file  Intimate Partner Violence: Not on file    Review of Systems:    Constitutional: No weight loss, fever, chills, weakness or fatigue HEENT: Eyes: No change in vision               Ears, Nose,  Throat:  No change in hearing or congestion Skin: No rash or itching Cardiovascular: No chest pain, chest pressure or palpitations   Respiratory: No SOB or cough Gastrointestinal: See HPI and otherwise negative Genitourinary: No dysuria or change in urinary frequency Neurological: No headache, dizziness or syncope Musculoskeletal: No new muscle or joint pain Hematologic: No bleeding or bruising Psychiatric: No history of depression or anxiety    Physical Exam:  Vital signs: There were no vitals taken for this visit.  Constitutional: NAD, alert and cooperative Head:  Normocephalic and atraumatic. Eyes:   PEERL, EOMI. No icterus. Conjunctiva pink. Respiratory: Respirations even and unlabored. Lungs clear to auscultation bilaterally.   No wheezes, crackles, or rhonchi.  Cardiovascular:  Regular rate and rhythm. No peripheral edema, cyanosis or pallor.  Gastrointestinal:  Soft, nondistended, nontender. No rebound or guarding. Normal  bowel sounds. No appreciable masses or hepatomegaly. Rectal:  Declines Msk:  Symmetrical without gross deformities. Without edema, no deformity or joint abnormality.  Neurologic:  Alert and  oriented x4;  grossly normal neurologically.  Skin:   Dry and intact without significant lesions or rashes. Psychiatric: Oriented to person, place and time. Demonstrates good judgement and reason without abnormal affect or behaviors.  Physical Exam    RELEVANT LABS AND IMAGING: CBC    Component Value Date/Time   WBC 7.9 06/16/2023 0925   RBC 5.14 06/16/2023 0925   HGB 15.0 06/16/2023 0925   HCT 45.1 06/16/2023 0925   PLT 347.0 06/16/2023 0925   MCV 87.6 06/16/2023 0925   MCHC 33.2 06/16/2023 0925   RDW 13.6 06/16/2023 0925   LYMPHSABS 1.8 06/16/2023 0925   MONOABS 0.6 06/16/2023 0925   EOSABS 0.1 06/16/2023 0925   BASOSABS 0.0 06/16/2023 0925    CMP     Component Value Date/Time   NA 140 06/16/2023 0925   K 5.0 06/16/2023 0925   CL 104 06/16/2023 0925   CO2 30 06/16/2023 0925   GLUCOSE 99 06/16/2023 0925   BUN 19 06/16/2023 0925   CREATININE 1.17 06/16/2023 0925   CALCIUM 9.5 06/16/2023 0925   PROT 6.7 06/16/2023 0925   ALBUMIN 4.3 06/16/2023 0925   AST 18 06/16/2023 0925   ALT 32 06/16/2023 0925   ALKPHOS 71 06/16/2023 0925   BILITOT 0.4 06/16/2023 0925     Assessment/Plan:   Assessment and Plan Assessment & Plan     Change in bowel habits Seen in 2017 for change in bowel habits and recommended to pursue colonoscopy but appears this was not completed.  Normal CBC/CMP/TSH.  Gigi Kyle [facility] 09/12/2023, 10:16 PM  Cc: Ezell Hollow, MD

## 2023-09-13 ENCOUNTER — Encounter: Payer: Self-pay | Admitting: Gastroenterology

## 2023-09-13 ENCOUNTER — Ambulatory Visit (INDEPENDENT_AMBULATORY_CARE_PROVIDER_SITE_OTHER): Admitting: Gastroenterology

## 2023-09-13 VITALS — BP 122/72 | HR 72 | Ht 75.0 in | Wt 241.0 lb

## 2023-09-13 DIAGNOSIS — R197 Diarrhea, unspecified: Secondary | ICD-10-CM | POA: Diagnosis not present

## 2023-09-13 DIAGNOSIS — R195 Other fecal abnormalities: Secondary | ICD-10-CM

## 2023-09-13 DIAGNOSIS — R1030 Lower abdominal pain, unspecified: Secondary | ICD-10-CM

## 2023-09-13 DIAGNOSIS — R140 Abdominal distension (gaseous): Secondary | ICD-10-CM

## 2023-09-13 MED ORDER — NA SULFATE-K SULFATE-MG SULF 17.5-3.13-1.6 GM/177ML PO SOLN: 1.0000 | Freq: Once | ORAL | 0 refills | Status: AC

## 2023-09-13 NOTE — Patient Instructions (Signed)
 A high fiber diet with plenty of fluids (up to 8 glasses of water daily) is suggested to relieve these symptoms.  Metamucil, 1 tablespoon once or twice daily can be used to keep bowels regular if needed.  You have been scheduled for a colonoscopy. Please follow written instructions given to you at your visit today.   If you use inhalers (even only as needed), please bring them with you on the day of your procedure.  DO NOT TAKE 7 DAYS PRIOR TO TEST- Trulicity (dulaglutide) Ozempic, Wegovy (semaglutide) Mounjaro (tirzepatide) Bydureon Bcise (exanatide extended release)  DO NOT TAKE 1 DAY PRIOR TO YOUR TEST Rybelsus (semaglutide) Adlyxin (lixisenatide) Victoza (liraglutide) Byetta (exanatide) ___________________________________________________________________________  Please follow up sooner if symptoms increase or worsen   Due to recent changes in healthcare laws, you may see the results of your imaging and laboratory studies on MyChart before your provider has had a chance to review them.  We understand that in some cases there may be results that are confusing or concerning to you. Not all laboratory results come back in the same time frame and the provider may be waiting for multiple results in order to interpret others.  Please give us  48 hours in order for your provider to thoroughly review all the results before contacting the office for clarification of your results.   _______________________________________________________  If your blood pressure at your visit was 140/90 or greater, please contact your primary care physician to follow up on this.  _______________________________________________________  If you are age 24 or older, your body mass index should be between 23-30. Your Body mass index is 30.12 kg/m. If this is out of the aforementioned range listed, please consider follow up with your Primary Care Provider.  If you are age 93 or younger, your body mass index should  be between 19-25. Your Body mass index is 30.12 kg/m. If this is out of the aformentioned range listed, please consider follow up with your Primary Care Provider.   ________________________________________________________  The [facility] providers would like to encourage you to use MYCHART to communicate with providers for non-urgent requests or questions.  Due to long hold times on the telephone, sending your provider a message by [facility] may be a faster and more efficient way to get a response.  Please allow 48 business hours for a response.  Please remember that this is for non-urgent requests.  _______________________________________________________ Thank you for trusting me with your gastrointestinal care!   Suzanna Erp, PA

## 2023-09-14 NOTE — Progress Notes (Signed)
 Reviewed. Colonoscopy for colon cancer screening and chronic diarrhea.

## 2023-09-30 ENCOUNTER — Encounter: Payer: Self-pay | Admitting: Gastroenterology

## 2023-10-13 ENCOUNTER — Ambulatory Visit: Admitting: Gastroenterology

## 2023-10-13 ENCOUNTER — Encounter: Payer: Self-pay | Admitting: Gastroenterology

## 2023-10-13 VITALS — BP 101/80 | HR 59 | Temp 97.6°F | Resp 10 | Ht 75.0 in | Wt 241.0 lb

## 2023-10-13 DIAGNOSIS — R195 Other fecal abnormalities: Secondary | ICD-10-CM

## 2023-10-13 DIAGNOSIS — K640 First degree hemorrhoids: Secondary | ICD-10-CM | POA: Diagnosis not present

## 2023-10-13 DIAGNOSIS — Z1211 Encounter for screening for malignant neoplasm of colon: Secondary | ICD-10-CM

## 2023-10-13 DIAGNOSIS — R197 Diarrhea, unspecified: Secondary | ICD-10-CM

## 2023-10-13 MED ORDER — SODIUM CHLORIDE 0.9 % IV SOLN: 500.0000 mL | Freq: Once | INTRAVENOUS | Status: DC

## 2023-10-13 NOTE — Op Note (Signed)
 [facility] Patient Name: Larry Yu Procedure Date: 10/13/2023 3:27 PM MRN: [id number] Endoscopist: Geralyn Knee E. Cherryl Corona , MD, [id number] Age: 58 Referring MD:  Date of Birth: Aug 27, 1965 Gender: Male Account #: [id number] Procedure:                Colonoscopy Indications:              Screening for colorectal malignant neoplasm, This                            is the patient's first colonoscopy, Incidental                            diarrhea noted Medicines:                Monitored Anesthesia Care Procedure:                Pre-Anesthesia Assessment:                           - Prior to the procedure, a History and Physical                            was performed, and patient medications and                            allergies were reviewed. The patient's tolerance of                            previous anesthesia was also reviewed. The risks                            and benefits of the procedure and the sedation                            options and risks were discussed with the patient.                            All questions were answered, and informed consent                            was obtained. Prior Anticoagulants: The patient has                            taken no anticoagulant or antiplatelet agents. ASA                            Grade Assessment: II - A patient with mild systemic                            disease. After reviewing the risks and benefits,                            the patient was deemed in satisfactory condition to  undergo the procedure.                           After obtaining informed consent, the colonoscope                            was passed under direct vision. Throughout the                            procedure, the patient's blood pressure, pulse, and                            oxygen saturations were monitored continuously. The                            CF [id number] #[id number] was introduced through  the anus                            and advanced to the the terminal ileum, with                            identification of the appendiceal orifice and IC                            valve. The colonoscopy was performed without                            difficulty. The patient tolerated the procedure                            well. The quality of the bowel preparation was good                            except the ascending colon was unsatisfactory. The                            terminal ileum, ileocecal valve, appendiceal                            orifice, and rectum were photographed. The bowel                            preparation used was SUPREP via split dose                            instruction. Scope In: 3:49:46 PM Scope Out: 4:09:11 PM Scope Withdrawal Time: 0 hours 14 minutes 45 seconds  Total Procedure Duration: 0 hours 19 minutes 25 seconds  Findings:                 The perianal and digital rectal examinations were                            normal. Pertinent negatives include normal  sphincter tone and no palpable rectal lesions.                           The exam was otherwise normal throughout the                            examined colon.                           Biopsies for histology were taken with a cold                            forceps from the ascending colon, transverse colon,                            descending colon and sigmoid colon for evaluation                            of microscopic colitis. Estimated blood loss was                            minimal.                           The terminal ileum appeared normal.                           Non-bleeding internal hemorrhoids were found during                            retroflexion. The hemorrhoids were Grade I                            (internal hemorrhoids that do not prolapse).                           No additional abnormalities were found on                             retroflexion. Complications:            No immediate complications. Estimated Blood Loss:     Estimated blood loss was minimal. Impression:               - The examined portion of the ileum was normal.                           - Non-bleeding internal hemorrhoids.                           - Biopsies were taken with a cold forceps from the                            ascending colon, transverse colon, descending colon  and sigmoid colon for evaluation of microscopic                            colitis. Recommendation:           - Patient has a contact number available for                            emergencies. The signs and symptoms of potential                            delayed complications were discussed with the                            patient. Return to normal activities tomorrow.                            Written discharge instructions were provided to the                            patient.                           - Resume previous diet.                           - Continue present medications.                           - Await pathology results.                           - Repeat colonoscopy in 3 years because the bowel                            preparation was suboptimal.                           - Consider 2-day bowel prep with subsequent                            colonoscopy. [name] E. Cherryl Corona, MD 10/13/2023 4:20:48 PM This report has been signed electronically.

## 2023-10-13 NOTE — Progress Notes (Signed)
 Pt's states no medical or surgical changes since previsit or office visit.

## 2023-10-13 NOTE — Patient Instructions (Signed)
 Resume previous diet Continue present medications  Await pathology results Repeat colonoscopy in 3 years  Consider 2-day bowel prep with subsequent colonoscopy  See handouts for hemorrhoids  YOU HAD AN ENDOSCOPIC PROCEDURE TODAY AT THE [facility]:   Refer to the procedure report that was given to you for any specific questions about what was found during the examination.  If the procedure report does not answer your questions, please call your gastroenterologist to clarify.  If you requested that your care partner not be given the details of your procedure findings, then the procedure report has been included in a sealed envelope for you to review at your convenience later.  YOU SHOULD EXPECT: Some feelings of bloating in the abdomen. Passage of more gas than usual.  Walking can help get rid of the air that was put into your GI tract during the procedure and reduce the bloating. If you had a lower endoscopy (such as a colonoscopy or flexible sigmoidoscopy) you may notice spotting of blood in your stool or on the toilet paper. If you underwent a bowel prep for your procedure, you may not have a normal bowel movement for a few days.  Please Note:  You might notice some irritation and congestion in your nose or some drainage.  This is from the oxygen used during your procedure.  There is no need for concern and it should clear up in a day or so.  SYMPTOMS TO REPORT IMMEDIATELY:  Following lower endoscopy (colonoscopy or flexible sigmoidoscopy):  Excessive amounts of blood in the stool  Significant tenderness or worsening of abdominal pains  Swelling of the abdomen that is new, acute  Fever of 100F or higher  For urgent or emergent issues, a gastroenterologist can be reached at any hour by calling [phone]. Do not use MyChart messaging for urgent concerns.   DIET:  We do recommend a small meal at first, but then you may proceed to your regular diet.  Drink plenty of fluids but  you should avoid alcoholic beverages for 24 hours.  ACTIVITY:  You should plan to take it easy for the rest of today and you should NOT DRIVE or use heavy machinery until tomorrow (because of the sedation medicines used during the test).    FOLLOW UP: Our staff will call the number listed on your records the next business day following your procedure.  We will call around 7:15- 8:00 am to check on you and address any questions or concerns that you may have regarding the information given to you following your procedure. If we do not reach you, we will leave a message.     If any biopsies were taken you will be contacted by phone or by letter within the next 1-3 weeks.  Please call us  at [phone] if you have not heard about the biopsies in 3 weeks.   SIGNATURES/CONFIDENTIALITY: You and/or your care partner have signed paperwork which will be entered into your electronic medical record.  These signatures attest to the fact that that the information above on your After Visit Summary has been reviewed and is understood.  Full responsibility of the confidentiality of this discharge information lies with you and/or your care-partner.

## 2023-10-13 NOTE — Progress Notes (Unsigned)
 History and Physical Interval Note:  10/13/2023 3:40 PM  Larry Yu  has presented today for endoscopic procedure(s), with the diagnosis of  Encounter Diagnosis  Name Primary?   Loose stools Yes  .  The various methods of evaluation and treatment have been discussed with the patient and/or family. After consideration of risks, benefits and other options for treatment, the patient has consented to  the endoscopic procedure(s).   The patient's history has been reviewed, patient examined, no change in status, stable for endoscopic procedure(s).  I have reviewed the patient's chart and labs.  Questions were answered to the patient's satisfaction.     Ashelynn Marks E. Cherryl Corona, MD [facility]

## 2023-10-13 NOTE — Progress Notes (Unsigned)
 Pt A/O x 3, gd SR's, pleased with anesthesia, report to RN

## 2023-10-14 ENCOUNTER — Telehealth: Payer: Self-pay

## 2023-10-14 NOTE — Telephone Encounter (Signed)
 Attempted f/u call. No answer, left VM.

## 2023-10-18 LAB — SURGICAL PATHOLOGY

## 2023-10-25 ENCOUNTER — Ambulatory Visit: Payer: Self-pay | Admitting: Gastroenterology

## 2023-10-25 NOTE — Progress Notes (Signed)
 Mr. Larry Yu,  The biopsies taken from your colon were normal and there was no evidence of microscopic colitis or chronic inflammatory changes to explain your diarrhea.  Please repeat colonoscopy in 3 years as discussed due to suboptimal bowel prep and follow up with us  in the office as needed for ongoing management of chronic diarrhea.

## 2023-11-13 NOTE — Progress Notes (Unsigned)
 HPI male smoker followed for OSA, complicated by ADHD/Amphetamine, Insomnia, complicated by Seasonal Allergic Rhinitis, Depression, Dyslipidemia, Gout, NPSG 04/06/14- AHI 87/ hr,  --------------------------------------------------------------------------------   11/12/22- 58 yoM former smoker > Vape> quit , followed for OSA, Insomnia, complicated by ADHD, Seasonal Allergic Rhinitis, Depression, Dyslipidemia, Gout,  -Melatonin 5mg ,  Amphetamine ER 18.8 mg, Lexapro , Wellbutrin  100, CPAP auto 4-15/ APS/[employer] AirSense 11 Download compliance- not available Body weight today-247 lbs Nasal pillows mask Little need for melatonin, but it works for him if he does need it.  11/15/23- 58 yoM former smoker > Vape> quit , followed for OSA, Insomnia, complicated by ADHD, Seasonal Allergic Rhinitis, Depression, Dyslipidemia, Gout,  -Melatonin 5mg ,  Amphetamine ER 18.8 mg, Lexapro , Wellbutrin  100, CPAP auto 4-15/ APS/[employer] AirSense 11 Download compliance- 77%, AHI 0.8/hr Body weight today-245 lbs -----Sleeping well.  No problems with CPAP. Gets supplies on-line Discussed the use of AI scribe software for clinical note transcription with the patient, who gave verbal consent to proceed.  History of Present Illness   Larry Yu is a 58 year old male with sleep apnea who presents for a follow-up on CPAP therapy.  He effectively uses his CPAP machine with a pressure range between five and fifteen centimeters of water, typically operating around eight or nine centimeters. He no longer experiences discomfort from excessive air pressure. He acknowledges insufficient sleep due to personal habits, not the machine's efficacy. He regularly takes prescription amphetamine and has not used melatonin recently. No significant issues with allergic rhinitis or breathing are present. He receives CPAP supplies regularly from an online provider.     Assessment and Plan:    Obstructive Sleep Apnea Obstructive sleep  apnea well-managed with CPAP. Excellent control with settings between 5-15 cm H2O, typically 8-9 cm. No significant issues with allergic rhinitis or breathing problems. - Continue current CPAP settings and usage. - Schedule follow-up in one year unless issues arise sooner. - Encouraged contact if problems with CPAP equipment or home care.     ROS-see HPI   + = positive Constitutional:    weight loss, night sweats, fevers, chills, fatigue, lassitude. HEENT:    headaches, difficulty swallowing, tooth/dental problems, sore throat,       sneezing, itching, ear ache, nasal congestion, post nasal drip, snoring CV:    chest pain, orthopnea, PND, swelling in lower extremities, anasarca,                                                             dizziness, palpitations Resp:   shortness of breath with exertion or at rest.                productive cough,   non-productive cough, coughing up of blood.              change in color of mucus.  wheezing.   Skin:    rash or lesions. GI:  No-   heartburn, indigestion, abdominal pain, nausea, vomiting,  GU:  MS:   joint pain, stiffness,  Neuro-     nothing unusual Psych:  change in mood or affect.  depression or anxiety.   memory loss.  OBJ- Physical Exam General- Alert, Oriented, Affect-appropriate, Diistress- none acute, big man Skin- rash-none, lesions- none, excoriation- none Lymphadenopathy- none Head- atraumatic  Eyes- Gross vision intact, PERRLA, conjunctivae and secretions clear            Ears- Hearing, canals-normal            Nose- Clear, no-Septal dev, mucus, polyps, erosion, perforation             Throat- Mallampati III-IV , mucosa , drainage- none, tonsils- atrophic, + teeth Neck- flexible , trachea midline, no stridor , thyroid  nl, carotid no bruit Chest - symmetrical excursion , unlabored           Heart/CV- RRR , no murmur , no gallop  , no rub, nl s1 s2                           - JVD- none , edema- none, stasis changes-  none, varices- none           Lung- clear to P&A, wheeze- none, cough- none , dullness-none, rub- none           Chest wall-  Abd-  Br/ Gen/ Rectal- Not done, not indicated Extrem- cyanosis- none, clubbing, none, atrophy- none, strength- nl Neuro- grossly intact to observation

## 2023-11-15 ENCOUNTER — Encounter: Payer: Self-pay | Admitting: Internal Medicine

## 2023-11-15 ENCOUNTER — Ambulatory Visit (INDEPENDENT_AMBULATORY_CARE_PROVIDER_SITE_OTHER): Payer: BC Managed Care – PPO | Admitting: Internal Medicine

## 2023-11-15 VITALS — BP 112/74 | HR 83 | Temp 98.3°F | Ht 75.0 in | Wt 245.0 lb

## 2023-11-15 DIAGNOSIS — G4733 Obstructive sleep apnea (adult) (pediatric): Secondary | ICD-10-CM | POA: Diagnosis not present

## 2023-11-15 NOTE — Patient Instructions (Signed)
 Glad you are doing well. Please call if we can help

## 2023-12-17 NOTE — Procedures (Signed)
Mask fit

## 2024-01-28 ENCOUNTER — Other Ambulatory Visit (HOSPITAL_BASED_OUTPATIENT_CLINIC_OR_DEPARTMENT_OTHER): Payer: Self-pay

## 2024-06-19 ENCOUNTER — Encounter: Payer: BC Managed Care – PPO | Admitting: Internal Medicine

## 2024-09-04 ENCOUNTER — Encounter: Admitting: Internal Medicine
